# Patient Record
Sex: Male | Born: 1960 | Race: Black or African American | Hispanic: No | Smoking: Never smoker
Health system: Southern US, Community
[De-identification: ages and names within clinical notes are randomized; demographics above are authoritative.]

## PROBLEM LIST (undated history)

## (undated) DIAGNOSIS — C61 Malignant neoplasm of prostate: Secondary | ICD-10-CM

---

## 2006-08-09 ENCOUNTER — Inpatient Hospital Stay: Payer: Self-pay | Admitting: Surgery

## 2007-12-04 IMAGING — CT CT ABD-PELV W/O CM
1 of 2 series · 15 of 32 positions shown, 19 images · non-contrast
Comparison: none

REASON FOR EXAM: (1) rm 9   abd pain; (2) rm 9  abd pain
COMMENTS:

[Series 2: abd/pelvis · axial · 0.77mm/px · z∈[-448,-40]mm · 15 of 153 slices shown, 19 images]
[im 11/153  soft-tissue]
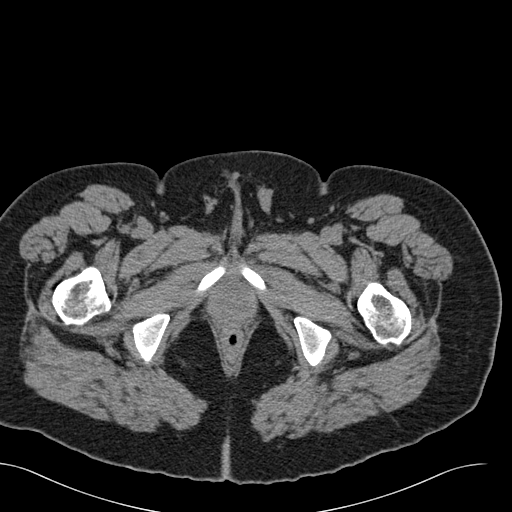
[im 11/153  bone]
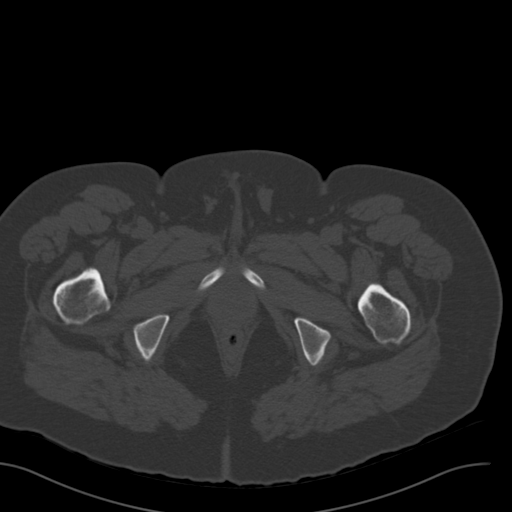
[im 22/153  soft-tissue]
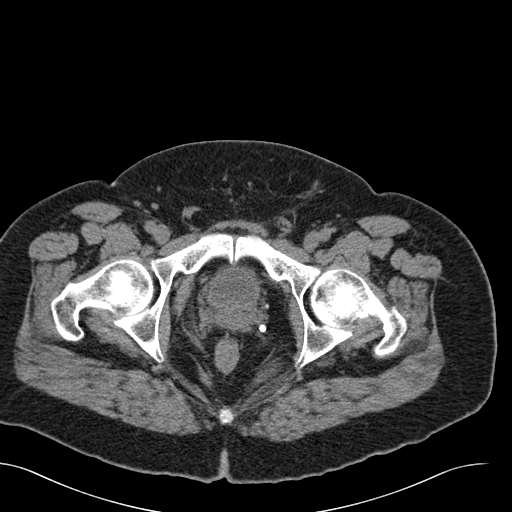
[im 33/153  soft-tissue]
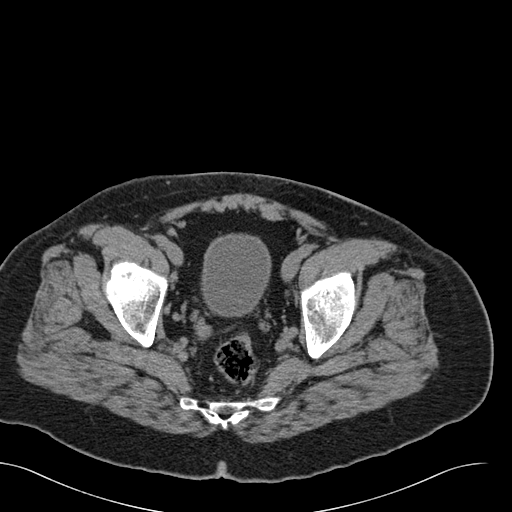
[im 44/153  soft-tissue]
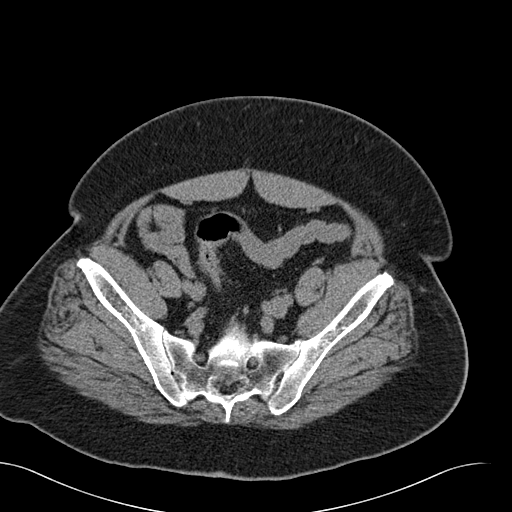
[im 55/153  soft-tissue]
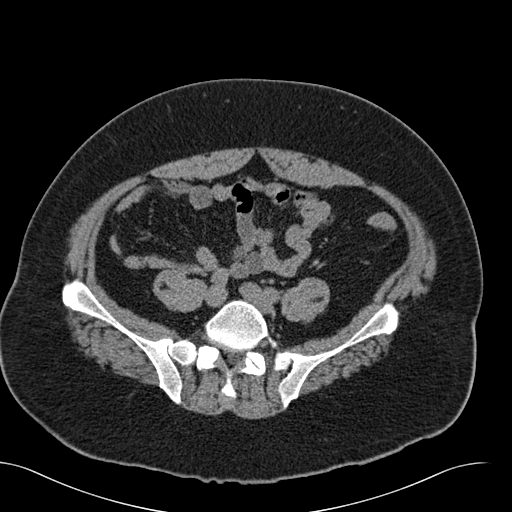
[im 66/153  soft-tissue]
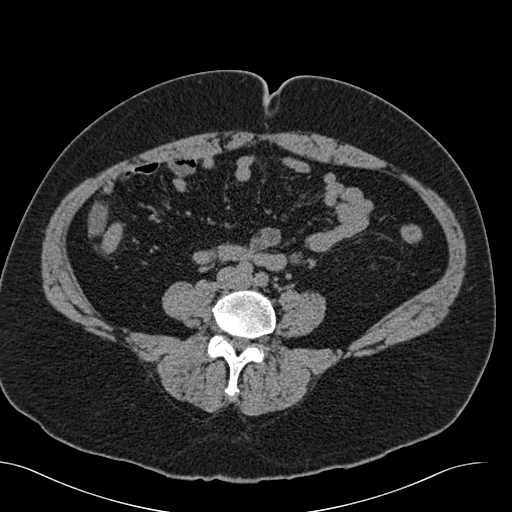
[im 77/153  soft-tissue]
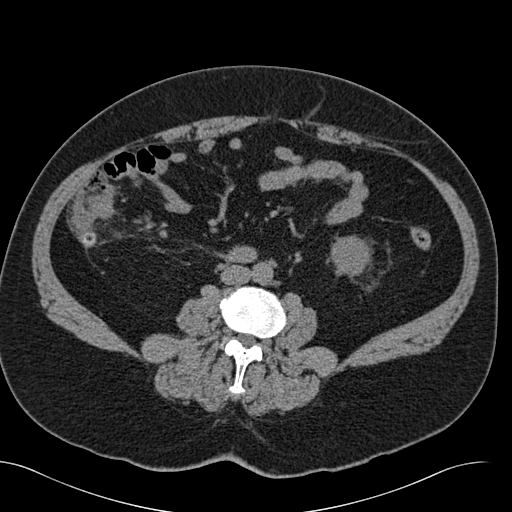
[im 87/153  soft-tissue]
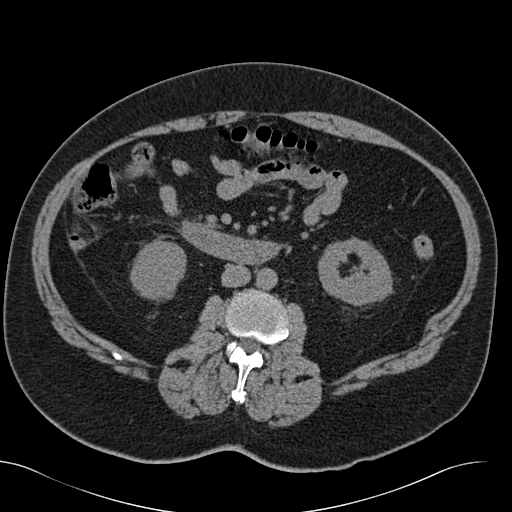
[im 98/153  soft-tissue]
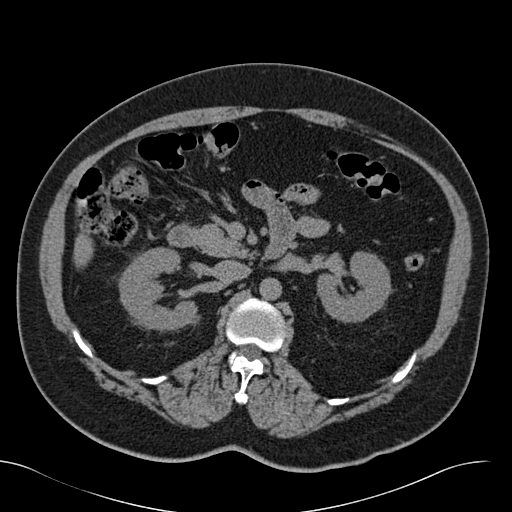
[im 98/153  bone]
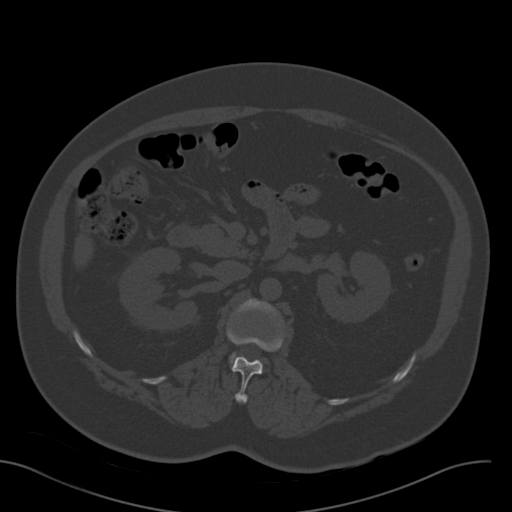
[im 109/153  soft-tissue]
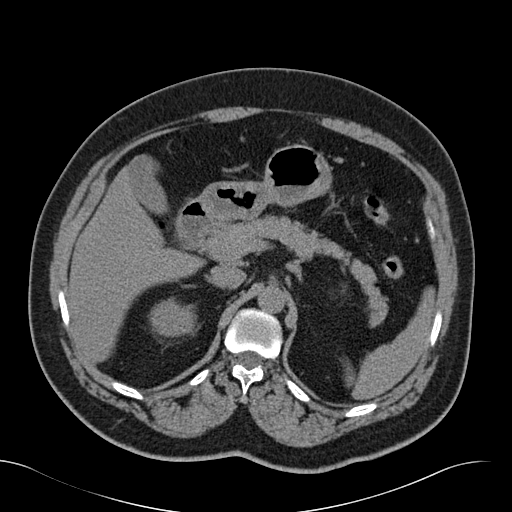
[im 120/153  soft-tissue]
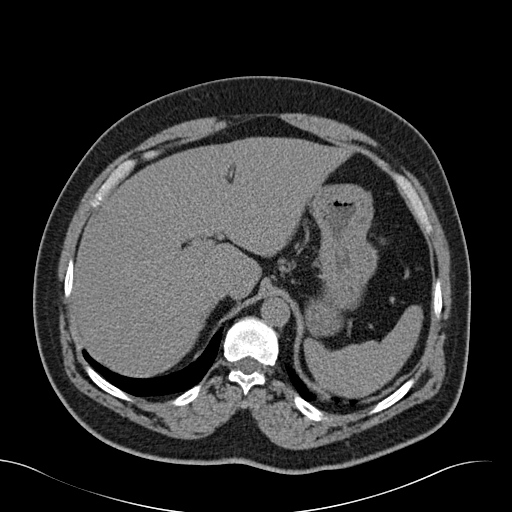
[im 131/153  soft-tissue]
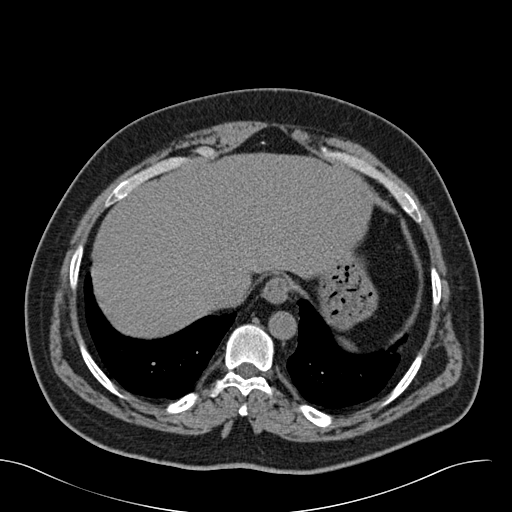
[im 131/153  lung]
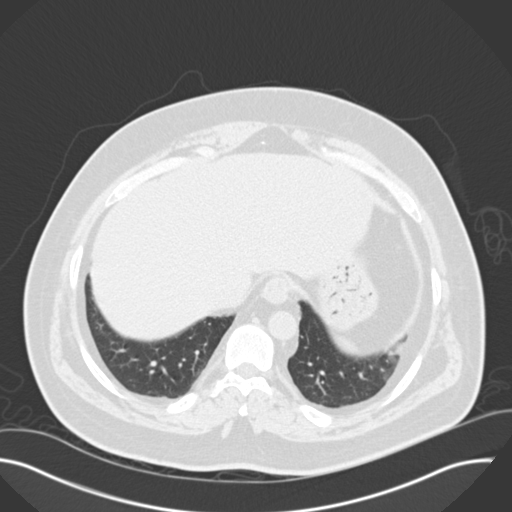
[im 136/153  lung]
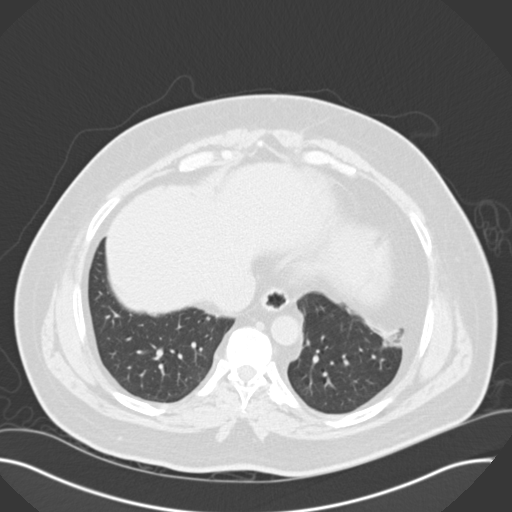
[im 142/153  soft-tissue]
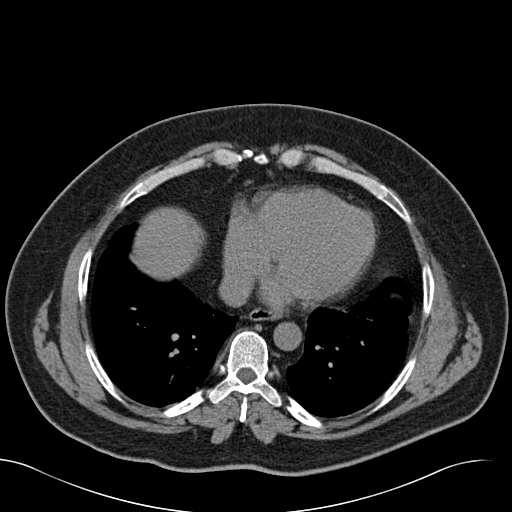
[im 142/153  lung]
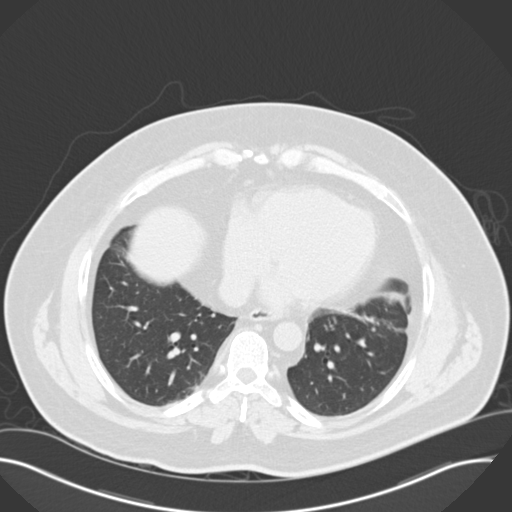
[im 147/153  lung]
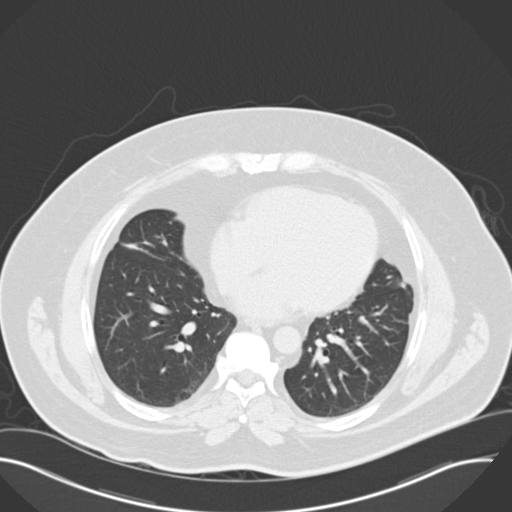

[15 of 32 positions shown; findings below may reference images not displayed]

PROCEDURE:     CT  - CT ABDOMEN AND PELVIS W[DATE]  [DATE]

RESULT:     The patient is complaining of abdominal discomfort.  The study
was performed with IV contrast or with oral contrast material.

There is an abnormal appearance to the RIGHT lower quadrant of the abdomen.
The appendix is dilated and edematous. There is increased density in the
periappendiceal fat. The abnormal findings are appreciated best on images 60
through 80. No extraluminal fluid or gas is identified.

There are punctate calcifications within the LEFT kidney but no evidence of
obstruction. The liver, gallbladder, pancreas, stomach, spleen, adrenal
glands, and RIGHT kidney are normal in appearance. The periaortic and
pericaval regions are normal in appearance. The unopacified loops of small
and large bowel appear normal outside of the RIGHT lower quadrant of the
abdomen. There is no evidence of ascites. The urinary bladder and prostate
gland are normal in appearance. The lung bases exhibit some atelectasis
versus scarring on the LEFT laterally. Trace pleural thickening versus
pleural fluid in the costophrenic gutter on the LEFT is noted.
IMPRESSION: 1. There are findings consistent with acute appendicitis.
2. I see no acute abnormality elsewhere within the abdomen or pelvis.
3. Minimal atelectasis and a trace of pleural fluid or pleural thickening
are noted in the posterior costophrenic gutter on the LEFT.

The findings were called to the [HOSPITAL] the conclusion of
the study.

## 2013-07-07 ENCOUNTER — Observation Stay: Payer: Self-pay | Admitting: Family Medicine

## 2013-07-07 ENCOUNTER — Emergency Department: Payer: Self-pay | Admitting: Emergency Medicine

## 2013-07-07 LAB — CBC
HCT: 48.9 % (ref 40.0–52.0)
HGB: 16.4 g/dL (ref 13.0–18.0)
MCH: 31.7 pg (ref 26.0–34.0)
MCHC: 33.5 g/dL (ref 32.0–36.0)
MCV: 95 fL (ref 80–100)
PLATELETS: 209 10*3/uL (ref 150–440)
RBC: 5.16 10*6/uL (ref 4.40–5.90)
RDW: 14.9 % — ABNORMAL HIGH (ref 11.5–14.5)
WBC: 6.1 10*3/uL (ref 3.8–10.6)

## 2013-07-07 LAB — COMPREHENSIVE METABOLIC PANEL
ALBUMIN: 3.6 g/dL (ref 3.4–5.0)
ALT: 71 U/L (ref 12–78)
AST: 69 U/L — AB (ref 15–37)
Alkaline Phosphatase: 127 U/L — ABNORMAL HIGH
Anion Gap: 8 (ref 7–16)
BUN: 19 mg/dL — ABNORMAL HIGH (ref 7–18)
Bilirubin,Total: 0.5 mg/dL (ref 0.2–1.0)
Calcium, Total: 9.4 mg/dL (ref 8.5–10.1)
Chloride: 99 mmol/L (ref 98–107)
Co2: 29 mmol/L (ref 21–32)
Creatinine: 1.49 mg/dL — ABNORMAL HIGH (ref 0.60–1.30)
EGFR (Non-African Amer.): 52 — ABNORMAL LOW
Glucose: 123 mg/dL — ABNORMAL HIGH (ref 65–99)
OSMOLALITY: 276 (ref 275–301)
Potassium: 3.8 mmol/L (ref 3.5–5.1)
Sodium: 136 mmol/L (ref 136–145)
Total Protein: 8.1 g/dL (ref 6.4–8.2)

## 2013-07-07 LAB — TROPONIN I
Troponin-I: <0.02 ng/mL
Troponin-I: <0.02 ng/mL

## 2013-07-07 LAB — PRO B NATRIURETIC PEPTIDE: B-Type Natriuretic Peptide: 14 pg/mL (ref 0–125)

## 2013-07-08 LAB — BASIC METABOLIC PANEL
ANION GAP: 8 (ref 7–16)
BUN: 21 mg/dL — AB (ref 7–18)
CO2: 28 mmol/L (ref 21–32)
Calcium, Total: 9.2 mg/dL (ref 8.5–10.1)
Chloride: 101 mmol/L (ref 98–107)
Creatinine: 1.36 mg/dL — ABNORMAL HIGH (ref 0.60–1.30)
EGFR (African American): >60
EGFR (Non-African Amer.): 59 — ABNORMAL LOW
GLUCOSE: 160 mg/dL — AB (ref 65–99)
OSMOLALITY: 280 (ref 275–301)
Potassium: 3.9 mmol/L (ref 3.5–5.1)
SODIUM: 137 mmol/L (ref 136–145)

## 2013-10-02 ENCOUNTER — Observation Stay: Payer: Self-pay | Admitting: Internal Medicine

## 2013-10-02 LAB — CBC
HCT: 56.5 % — AB (ref 40.0–52.0)
HGB: 18.7 g/dL — AB (ref 13.0–18.0)
MCH: 30.9 pg (ref 26.0–34.0)
MCHC: 33.1 g/dL (ref 32.0–36.0)
MCV: 94 fL (ref 80–100)
Platelet: 243 10*3/uL (ref 150–440)
RBC: 6.05 10*6/uL — ABNORMAL HIGH (ref 4.40–5.90)
RDW: 13.5 % (ref 11.5–14.5)
WBC: 8.2 10*3/uL (ref 3.8–10.6)

## 2013-10-02 LAB — PRO B NATRIURETIC PEPTIDE: B-Type Natriuretic Peptide: 11 pg/mL (ref 0–125)

## 2013-10-02 LAB — BASIC METABOLIC PANEL
Anion Gap: 6 — ABNORMAL LOW (ref 7–16)
BUN: 15 mg/dL (ref 7–18)
CALCIUM: 9.7 mg/dL (ref 8.5–10.1)
CHLORIDE: 97 mmol/L — AB (ref 98–107)
CREATININE: 1.45 mg/dL — AB (ref 0.60–1.30)
Co2: 32 mmol/L (ref 21–32)
EGFR (African American): >60
EGFR (Non-African Amer.): 54 — ABNORMAL LOW
Glucose: 125 mg/dL — ABNORMAL HIGH (ref 65–99)
OSMOLALITY: 272 (ref 275–301)
POTASSIUM: 3 mmol/L — AB (ref 3.5–5.1)
SODIUM: 135 mmol/L — AB (ref 136–145)

## 2013-10-02 LAB — TROPONIN I: Troponin-I: <0.02 ng/mL

## 2013-10-02 LAB — TSH: Thyroid Stimulating Horm: 1.82 u[IU]/mL

## 2013-10-02 LAB — MAGNESIUM: Magnesium: 1.5 mg/dL — ABNORMAL LOW

## 2013-10-02 LAB — PROTIME-INR
INR: 1
Prothrombin Time: 12.6 secs (ref 11.5–14.7)

## 2013-10-03 LAB — BASIC METABOLIC PANEL
ANION GAP: 8 (ref 7–16)
BUN: 12 mg/dL (ref 7–18)
CHLORIDE: 99 mmol/L (ref 98–107)
CO2: 31 mmol/L (ref 21–32)
Calcium, Total: 9.3 mg/dL (ref 8.5–10.1)
Creatinine: 1.19 mg/dL (ref 0.60–1.30)
EGFR (African American): >60
EGFR (Non-African Amer.): >60
GLUCOSE: 119 mg/dL — AB (ref 65–99)
Osmolality: 277 (ref 275–301)
Potassium: 3.2 mmol/L — ABNORMAL LOW (ref 3.5–5.1)
Sodium: 138 mmol/L (ref 136–145)

## 2013-10-03 LAB — CBC WITH DIFFERENTIAL/PLATELET
BASOS ABS: 0 10*3/uL (ref 0.0–0.1)
Basophil %: 0.5 %
Eosinophil #: 0.1 10*3/uL (ref 0.0–0.7)
Eosinophil %: 1.9 %
HCT: 52.9 % — ABNORMAL HIGH (ref 40.0–52.0)
HGB: 17.1 g/dL (ref 13.0–18.0)
Lymphocyte #: 1.9 10*3/uL (ref 1.0–3.6)
Lymphocyte %: 27.5 %
MCH: 30.2 pg (ref 26.0–34.0)
MCHC: 32.4 g/dL (ref 32.0–36.0)
MCV: 93 fL (ref 80–100)
MONO ABS: 0.8 x10 3/mm (ref 0.2–1.0)
Monocyte %: 12.3 %
NEUTROS PCT: 57.8 %
Neutrophil #: 4 10*3/uL (ref 1.4–6.5)
Platelet: 233 10*3/uL (ref 150–440)
RBC: 5.67 10*6/uL (ref 4.40–5.90)
RDW: 13.9 % (ref 11.5–14.5)
WBC: 6.9 10*3/uL (ref 3.8–10.6)

## 2013-10-03 LAB — TSH: THYROID STIMULATING HORM: 1.98 u[IU]/mL

## 2013-10-03 LAB — LIPID PANEL
CHOLESTEROL: 170 mg/dL (ref 0–200)
HDL: 40 mg/dL (ref 40–60)
LDL CHOLESTEROL, CALC: 75 mg/dL (ref 0–100)
Triglycerides: 276 mg/dL — ABNORMAL HIGH (ref 0–200)
VLDL CHOLESTEROL, CALC: 55 mg/dL — AB (ref 5–40)

## 2013-10-03 LAB — TROPONIN I: Troponin-I: <0.02 ng/mL

## 2013-10-03 LAB — HEMOGLOBIN A1C: Hemoglobin A1C: 6.6 % — ABNORMAL HIGH (ref 4.2–6.3)

## 2013-10-03 LAB — MAGNESIUM: MAGNESIUM: 2.2 mg/dL

## 2014-04-01 IMAGING — CR DG CHEST 2V
1 series · 2 of 2 positions shown · non-contrast
Comparison: CT abdomen/pelvis [DATE]

CLINICAL DATA: Cough and congestion 2 weeks.

EXAM:
CHEST  2 VIEW

[Series 1: w chest pa · 0.14mm/px · 2 of 2 slices shown]
[im 1/2]
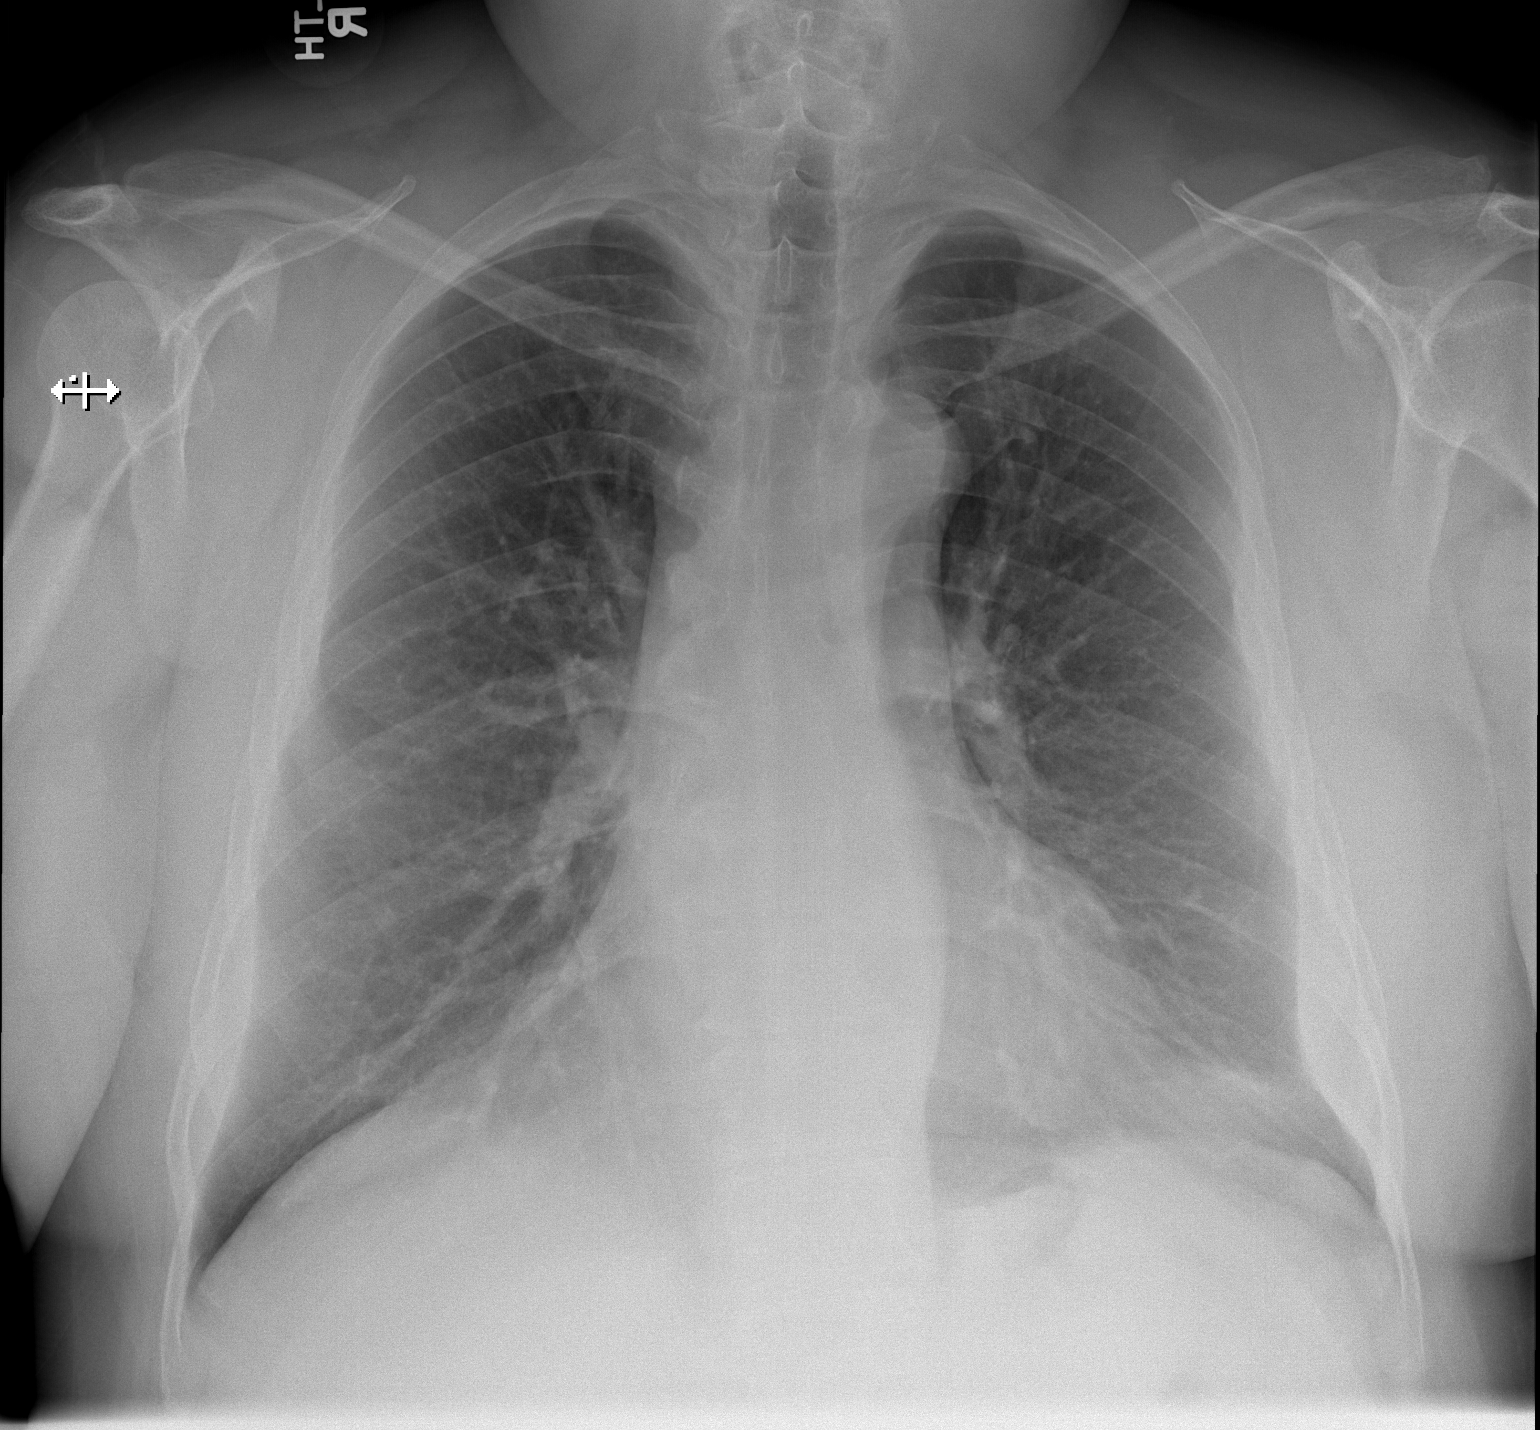
[im 2/2]
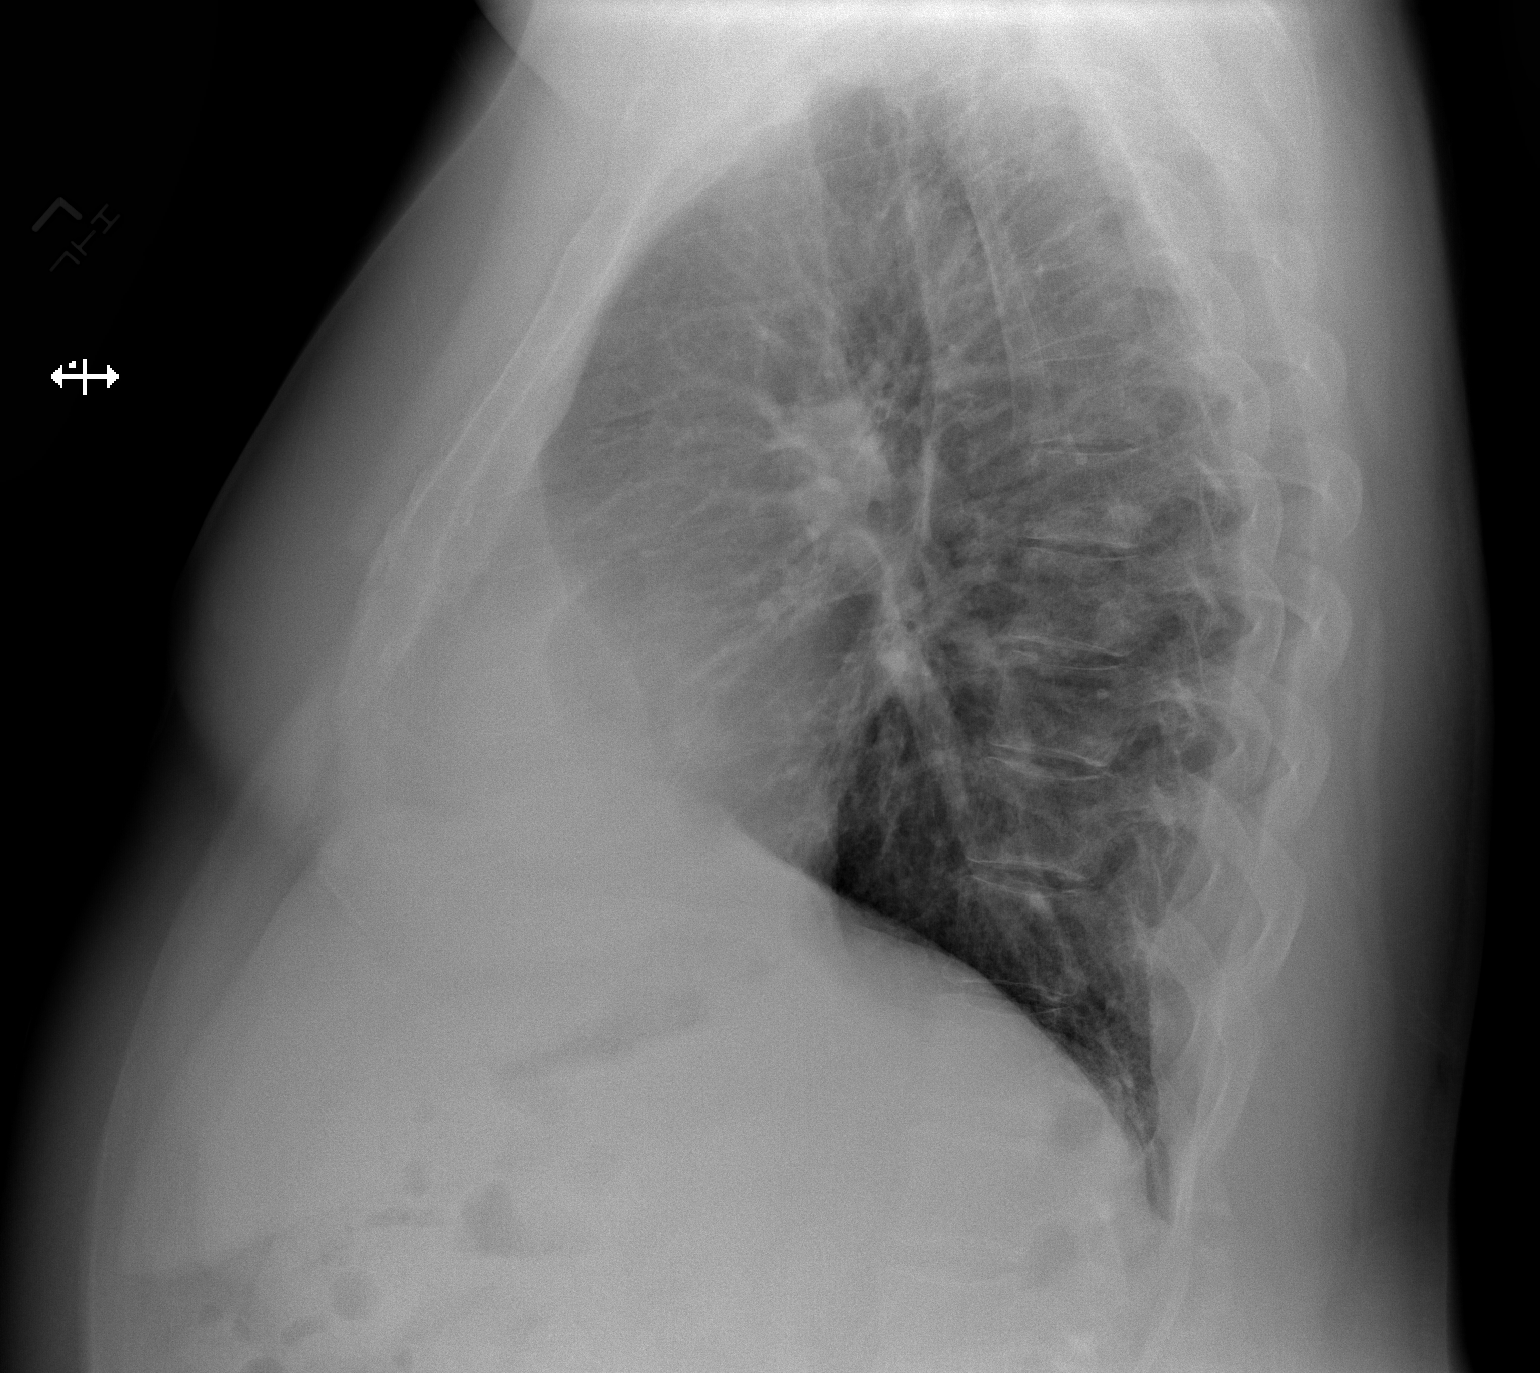

[2 of 2 positions shown; findings below may reference images not displayed]

FINDINGS: Lungs are adequately inflated with subtle opacification in the left
base just above the hemidiaphragm not well seen on the lateral film.
There is mild smooth pleural thickening of the lateral lower thorax
bilaterally left worse than right with similar changes seen on
patient's prior CT scan. Cardiomediastinal silhouette is within
normal. There is minimal spondylosis of the spine.
IMPRESSION: Subtle hazy opacification just above the left hemidiaphragm along
the left heart border likely prominent pericardial fat, although
cannot exclude a pulmonary parenchymal process.

## 2014-09-13 NOTE — Discharge Summary (Signed)
PATIENT NAME:  Paul Hickman, [name] MR#:  [id number] DATE OF BIRTH:  1960-10-10  DATE OF ADMISSION:  10/02/2013 DATE OF DISCHARGE:  10/03/2013   PRIMARY CARE PHYSICIAN:  Dr. Riley KillSwartz.    DISCHARGE DIAGNOSES:  Palpitations, hypertension, diabetes, hyperlipidemia.   CONDITION:  Stable.   CODE STATUS:  FULL CODE.   HOME MEDICATIONS:  Please refer to the medication reconciliation list.   DIET:  Low sodium, low fat.   ACTIVITY:  As tolerated.   FOLLOWUP CARE:  Follow up with PCP within 1 to 2 weeks.   REASON FOR ADMISSION:  Palpitations, shortness of breath.   HOSPITAL COURSE:  The patient is a 54 year old African American male with a history of hypertension, gastroesophageal reflux disease, hyperlipidemia, came to the ED due to palpitations and associated shortness of breath. The patient was TAKEN OFF LISINOPRIL DUE TO ALLERGY in February of this year and was started on Norvasc 2 days previous to this admission. For detailed history and physical examination, please refer to the admission note dictated by Dr. Elisabeth PigeonVachhani.  Laboratory data on admission date showed glucose 125, BUN 15, creatinine 1.45. Electrolytes were normal. His troponin was less than 0.02.  1.  Palpitations and uncontrolled hypertension.  After admission, the patient's troponin level has been negative. The patient got echocardiogram, for which report is pending. The patient's palpitations are better, but still has a little bit of palpitations without any chest pain or nausea, vomiting. The patient's hypertension has been controlled with atenolol, Norvasc.  In addition, the patient's hyperlipidemia has been treated with statin. Since the patient is getting Norvasc, I discontinued Zocor and start Lipitor. The patient's hypokalemia and hypomagnesemia has been treated with supplements. The patient's vital signs are stable.  Telemonitor shows normal sinus rhythm. Physical examination is unremarkable. He is clinically stable and will be discharged  to home today. The patient needs to follow up with PCP for echocardiogram report.    I discussed the patient's discharge plan with the patient, nurse, case manager.   TIME SPENT: About 37 minutes.   ____________________________ Shaune PollackQing Yocheved Depner, MD qc:dmm D: 10/03/2013 17:14:50 ET T: 10/03/2013 18:57:52 ET JOB#: [id number]  cc: Shaune PollackQing Kyana Aicher, MD, <Dictator> Shaune PollackQING Allexis Bordenave MD ELECTRONICALLY SIGNED 10/04/2013 14:51

## 2014-09-13 NOTE — H&P (Signed)
PATIENT NAME:  Paul Hickman, Paul Hickman MR#:  [id number] DATE OF BIRTH:  [birth year]  DATE OF ADMISSION:  07/07/2013  PRIMARY CARE PHYSICIAN: At [facility].   CHIEF COMPLAINT: Lower lip swelling and shortness of breath.   HISTORY OF PRESENT ILLNESS: This is a 54 year old male who presents to the hospital due to significant swelling of his lower lip that began earlier today and also associated with some wheezing and shortness of breath. The patient says that he has been fighting an upper respiratory infection now for the past 2 to 3 weeks. He thought it had improved about a week or so ago, but his no postnasal drip and cough and congestion returned. He, therefore, came to the ER for further evaluation earlier today. He was diagnosed with possibly having acute bronchitis and was discharged on an oral prednisone taper and Levaquin. He was given 1 dose of IV Levaquin here in the ER. The patient went home. Had not had his prescription even filled yet when he noticed that his lower lip started to significantly swell up. He said that this has happened previously in the past, but his lower lip has not been this significantly swollen before. Previously, he used to take a little bit of Zyrtec, and his symptom would resolve by itself. Since his symptoms were worse this time, he came to the ER for further evaluation. The patient has been on an ACE inhibitor but has been on it for now 10 to 14 years. He has never really truly sought the help of an allergist. The patient here likely has underlying angioedema, and hospitalist services were contacted for further treatment and evaluation.   REVIEW OF SYSTEMS:  CONSTITUTIONAL: No documented fever. No weight gain. No weight loss.  EYES: No blurry or double vision.  ENT: No tinnitus. Positive postnasal drip. No redness of the oropharynx.  RESPIRATORY: Positive cough. Positive wheeze. No hemoptysis. Positive dyspnea on exertion.  CARDIOVASCULAR: Positive chest tightness on  inspiration. Positive palpitations. No syncope.  GASTROINTESTINAL: No nausea. No vomiting. No diarrhea. No abdominal pain. No melena or hematochezia.  GENITOURINARY: No dysuria or hematuria.  ENDOCRINE: No polyuria or nocturia. No heat or cold intolerance.  HEMATOLOGIC: No anemia. No bruising or bleeding.  INTEGUMENTARY: No rashes or lesions.  MUSCULOSKELETAL: No arthritis. No swelling. No Gout .  NEUROLOGIC: No numbness. No tingling. No ataxia. No seizure-type activity.  PSYCHIATRIC: No anxiety. No insomnia. No ADD.   PAST MEDICAL HISTORY: Consistent with hypertension, gout, GERD, hyperlipidemia.   ALLERGIES: No known drug allergies.   SOCIAL HISTORY: No smoking. Occasional alcohol use. No illicit drug abuse. Lives at home by himself.   FAMILY HISTORY: The patient's mother is alive. She is also diabetic. Father died from complications of diabetes.   CURRENT MEDICATIONS: As follows: Allopurinol 300 mg daily, aspirin 81 mg daily, HCTZ/triamterene 50/75 mg tablet 1 tab daily, Levaquin 500 mg daily for 10 days, lisinopril 40 mg daily, prednisone taper starting at 60 mg down to 10 mg over the next 6 days, Prilosec over the counter 20 mg daily, albuterol inhaler 2 puffs q.4 hours as needed, Tenormin 100 mg daily, Zocor 20 mg daily.   PHYSICAL EXAMINATION: Presently is as follows:  VITAL SIGNS: Are noted to be temperature 97, pulse 115, respirations 18, blood pressure 172/101, sats 96% on room air.  GENERAL: He is a pleasant-appearing male in mild respiratory distress.  HEAD, EYES, EARS, NOSE, THROAT: The patient is atraumatic, normocephalic. Extraocular muscles are intact. Pupils are equal and  reactive to light. Sclerae anicteric. No conjunctival injection. No oropharyngeal erythema. His lower lip is significantly swollen.  NECK: Supple. There is no jugular distention. No bruits. No lymphadenopathy or thyromegaly.  HEART: Regular rate, and rhythm, tachycardic. No murmurs. No rubs. No clicks.   LUNGS: He has coarse wheezing and rhonchi diffusely. Negative use of accessory muscles. No dullness to percussion.  ABDOMEN: Soft, flat, nontender, nondistended. Has good bowel sounds. No hepatosplenomegaly appreciated.  EXTREMITIES: No evidence of any cyanosis, clubbing or peripheral edema. Has +2 pedal and radial pulses bilaterally.  NEUROLOGIC: The patient is alert, awake and oriented x 3 with no focal motor or sensory deficits appreciated bilaterally.  SKIN: Moist and warm with no rashes appreciated.  LYMPHATIC: There is no cervical or axillary lymphadenopathy.   LABORATORY EXAM: Showed a serum glucose of 123, BUN 19, creatinine 1.4, sodium 136, potassium 3.8, chloride 99, bicarb 29. LFTs are within normal limits. Troponin less than 0.02. White cell count 6.1, hemoglobin 16.4, hematocrit 48.9, platelet count of 209. The patient did have a chest x-ray done which showed no evidence of any acute cardiopulmonary disease.   ASSESSMENT AND PLAN: This is a 54 year old male with a history of hypertension, hyperlipidemia, gout, gastroesophageal reflux disease. Presented to the hospital earlier today due to upper respiratory infection not improving in 2 to 3 weeks. Was discharged from the Emergency Room on oral prednisone and Levaquin. Now returns with significant lower lip swelling and likely angioedema.  1. Angioedema: The exact etiology of this is unclear but suspected to be secondary to the ACE inhibitor, although he has been on it now 10 plus years. The patient says that he has had this lower lip swelling in the past through time since 2002, but it always resolves by itself within 24 hours. It has never been this bad in the previous episodes. For now, will discontinue his ACE inhibitor. I will go ahead and check C4 levels to possibly rule out C1 esterase inhibitor deficiency, although that is unlikely. Continue supportive care with intravenous steroids, as needed Benadryl and start him on some ranitidine.  The patient likely needs to be officially evaluated by an allergist as an outpatient.  2. Hypertension: I will continue triamterene/HCTZ and his Tenormin. Hold his ACE inhibitor indefinitely. Use p.r.n. intravenous hydralazine if needed.  3. Gastroesophageal reflux disease: Continue with his Protonix.  4. History of gout: There is no acute gout attack. Continue allopurinol.  5. Hyperlipidemia: Continue simvastatin.   The patient is a FULL CODE.   TIME SPENT ON ADMISSION: 45 minutes.    ____________________________ Rolly PancakeVivek J. Cherlynn KaiserSainani, MD vjs:gb D: 07/07/2013 20:50:23 ET T: 07/08/2013 00:54:19 ET JOB#: [id number]  cc: Rolly PancakeVivek J. Cherlynn KaiserSainani, MD, <Dictator> Houston SirenVIVEK J Jamy Cleckler MD ELECTRONICALLY SIGNED 07/08/2013 19:08

## 2014-09-13 NOTE — H&P (Signed)
PATIENT NAME:  Paul Hickman, Paul Hickman MR#:  [id number] DATE OF BIRTH:  [birth year]  DATE OF ADMISSION:  10/02/2013  PRIMARY CARE PHYSICIAN: At [facility].   CHIEF COMPLAINT: Palpitations and shortness of breath.   REFERRING EMERGENCY ROOM PHYSICIAN: Rockne MenghiniAnne-Caroline Norman, MD  HISTORY OF PRESENT ILLNESS:  A 54 year old male with past medical history of hypertension, gout, gastroesophageal reflux disease, hyperlipidemia, who was recently diagnosed with ALLERGY TO LISINOPRIL in February 2015 and taken off from that. Since then, he said that his blood pressure is running high, before that pressure was remaining 130/80 but since then, after taking off from lisinopril, pressure went up to 160/100 to 110. He had a visit to his [facility]'s [facility] and they started on amlodipine which he started taking 2 days ago but for the last 3 days the patient is also feeling palpitations which is intermittent, comes for a few seconds and then gone and every few minutes after again he feels that palpitation again. He denies any chest pain but complains that he feels some shortness of breath with this palpitation. There is no aggravating or relieving factor with that and, worrying about this, he decided to come to the Emergency Room after he spoke to his primary care physician and he suggested the same. In the ER, his blood pressure was high, diastolic was much higher so given injection hydralazine and given as admission to hospitalist team for further management of his issue. Was also found to be hypokalemic, potassium level 3, and hemoglobin of 18.7.   REVIEW OF SYSTEMS:    CONSTITUTIONAL: Negative for fever, fatigue, weakness, pain or weight loss.  EYES: No blurring, double vision, discharge or redness.  EARS, NOSE AND THROAT:  No tinnitus, ear pain or hearing loss. RESPIRATORY:  Has been feeling some shortness of breath but no wheezing, cough or sputum.  CARDIOVASCULAR: No chest pain, edema, arrhythmia but has  been feeling palpitations on and off.  GASTROINTESTINAL: No nausea, vomiting, diarrhea, abdominal pain.  GENITOURINARY: No dysuria, hematuria or increased frequency.  ENDOCRINE: No heat or cold intolerance, no excessive sweating.  SKIN: No acne, rashes or lesions on the skin.  MUSCULOSKELETAL: No pain or swelling in the joints.  NEUROLOGICAL: No numbness, weakness, tremor or vertigo.  PSYCHIATRIC: No anxiety, insomnia, bipolar disorder.   PAST MEDICAL HISTORY:  Hypertension, hyperlipidemia, gout and gastroesophageal reflux disease.   SOCIAL HISTORY: No smoking, occasional alcohol use. No illicit drug use. Lives at home by himself.   FAMILY HISTORY: Strong diabetes history in sister, brother and mother. Father died from complications of diabetes. His brother had a stroke twice, 1 at 7746 and the second one at 1856, when he died.   HOME MEDICATIONS: 1.  ProAir HFA 2 puffs inhalation every 4 hours.  2.  Prilosec 20 mg oral tablet once a day.  3.  Hydrochlorothiazide and triamterene 50 plus 75 mg oral tablet once a day.  4.  Cholecalciferol 1000 International Units oral tablet once a day.  5.  Atenolol 50 mg oral tablet 2 tablets once a day.  6.  Aspirin 81 mg oral once a day.  7.  Amlodipine 10 mg oral take 1/2 tablet once a day.  8.  Allopurinol 300 oral once a day.   PHYSICAL EXAMINATION: VITAL SIGNS:  In ER, temperature 97.7, pulse 99, respirations 20, blood pressure 168/112 which came down to 156/99 after injection hydralazine and pulse oximetry is 98% on room air.  GENERAL: The patient is fully alert  and oriented to time, place and person. Does not appear in any acute distress.  HEENT: Head and neck atraumatic. Conjunctiva pink. Oral mucosa moist.  NECK: Supple. No JVD.  RESPIRATORY: Bilateral equal and clear air entry.  CARDIOVASCULAR: S1, S2 present, regular. No murmur.  ABDOMEN: Soft, nontender. Bowel sounds present. No organomegaly.  SKIN: No rashes.  LEGS: No edema.  JOINTS: No  swelling or tenderness.  NEUROLOGICAL: Power 5 out of 5. Moves all 4 limbs. No sensory loss. No tremor or rigidity.  PSYCHIATRIC: Does not appear in any acute psychiatric illness.   LABORATORY, DIAGNOSTIC AND RADIOLOGICAL DATA:  Glucose 125. BNP 11. BUN is 15, creatinine 1.45, sodium 135, potassium is 3, chloride is 97, CO2 is 32, calcium is 9.7, magnesium is 1.5. Troponin less than 0.02. TSH is 1.82. WBC 8.2, hemoglobin 18.7, platelet count is 243 and MCV is 94. INR is 1.   ASSESSMENT AND PLAN: A 54 year old male with a past medical history of hypertension, hyperlipidemia and gout, presented to Emergency Room with high blood pressure and intermittent palpitations.  1.  Palpitations and uncontrolled hypertension. We will monitor him on telemetry with serial troponins and will get echocardiogram done.  2.  Hypertension. We will continue his home medication, triamterene and hydrochlorothiazide. Will also continue his atenolol 100 mg as he was taking at home and amlodipine which he only took 2 days now so we will continue that and once it has more stable blood level after 4 to 5 days might have better effect on maintaining the blood pressure.  3.  Hyperlipidemia. Continue statin.  4.  Gout. Continue allopurinol.  5.  Hypokalemia. Replace orally and check tomorrow.  6.  Hypomagnesemia. Replace IV and check tomorrow.  7.  Elevated hemoglobin, mostly polycythemia. Will have to get further workup as per the hematology plan so I ordered hematology consult for that. Maybe this is a reason for his palpitation. Will follow hematology.  8.  CODE STATUS: Full code.   TOTAL TIME SPENT ON THIS ADMISSION: 50 minutes.    ____________________________ Hope Pigeon Elisabeth Pigeon, MD vgv:cs D: 10/02/2013 20:05:15 ET T: 10/02/2013 20:26:47 ET JOB#: [id number]  cc: Hope Pigeon. Elisabeth Pigeon, MD, <Dictator> Altamese Dilling MD ELECTRONICALLY SIGNED 10/15/2013 16:30

## 2014-09-13 NOTE — Discharge Summary (Signed)
PATIENT NAME:  Paul Hickman, Paul Hickman MR#:  [id number] DATE OF BIRTH:  31-Mar-1961  DATE OF ADMISSION:  07/07/2013 DATE OF DISCHARGE:  07/08/2013  REASON FOR ADMISSION: Angioedema.   DISCHARGE DIAGNOSES: 1.  Angioedema.  2.  Acute bronchitis.  3.  Hypertension.  4.  Gastroesophageal reflux disease.   5.  Gout.  6.  Hyperlipidemia.   DISPOSITION: Home.   FOLLOWUP: [facility]   MEDICATIONS: Aspirin 81 mg daily, hydrochlorothiazide with triamterene 50/75 mg daily, allopurinol 300 mg daily, Prilosec 20 mg once a day, Tenormin 100 mg once a day, Zocor 20 mg once a day, ProAir HFA 90 mcg 2 puffs every 4 hours for wheezing, right now it is scheduled and changed to p.Hickman.n. when needed; prednisone 20 mg once a day for 2 days, azithromycin 250 mg once a day for 4 days.   HOSPITAL COURSE: A nice 54 year old gentleman admitted on 07/07/2013 with a history of shortness of breath and lip swelling. The patient had problems with lip swelling earlier during the day and then started associated wheezing and shortness of breath. The patient has been fighting an upper respiratory infection for 2 to 3 weeks without any improvement and then started having postnasal drip and congestion. He was seen before in the ER and was diagnosed with bronchitis and discharged on Levaquin and prednisone. He did not do take all his prednisone, actually he had some left.  He received 1 dose of antibiotics.  At this moment, he is going to be discharged on Zyrtec. The patient has been on his any an ACE inhibitor for 10 to 14 years and he states that the angioedema is something that happens at least once a year. At this moment, this time was associated with significant shortness of breath, (and edema of lip. we recommended to stop it.  The patient is going to go back to [place] primary care physician in [place].  The patient states that he has been taking lisinopril despite the angioedema because he is healthy and he has never had any major complications  other than occasional lip swelling. At this moment, again, the patient has been counseled and he is not going to take that anymore.  As far as his other medical problems, they are stable.   TIME SPENT: I spent about 40 minutes with this discharge.   The patient is going to be discharged on azithromycin and 2 days' course of prednisone for treatment of acute bronchitis as well.   ____________________________ Felipa Furnaceoberto Sanchez Gutierrez, MD rsg:cs D: 07/08/2013 14:26:29 ET T: 07/08/2013 15:04:11 ET JOB#: [id number]  cc: Felipa Furnaceoberto Sanchez Gutierrez, MD, <Dictator> [facility]  Milly Goggins Juanda ChanceSANCHEZ GUTIERRE MD ELECTRONICALLY SIGNED 07/13/2013 20:23

## 2022-12-23 ENCOUNTER — Emergency Department
Admission: EM | Admit: 2022-12-23 | Discharge: 2022-12-23 | Disposition: A | Discharge status: Home / Self Care | Payer: No Typology Code available for payment source | Attending: Emergency Medicine | Admitting: Emergency Medicine

## 2022-12-23 ENCOUNTER — Other Ambulatory Visit: Payer: Self-pay

## 2022-12-23 DIAGNOSIS — M549 Dorsalgia, unspecified: Secondary | ICD-10-CM | POA: Diagnosis present

## 2022-12-23 DIAGNOSIS — M4807 Spinal stenosis, lumbosacral region: Secondary | ICD-10-CM | POA: Diagnosis not present

## 2022-12-23 MED ORDER — HYDROCODONE-ACETAMINOPHEN 5-325 MG PO TABS: 1.0000 | ORAL_TABLET | Freq: Four times a day (QID) | ORAL | 0 refills | Status: DC | PRN

## 2022-12-23 MED ORDER — METHYLPREDNISOLONE 4 MG PO TBPK: ORAL_TABLET | ORAL | 0 refills | Status: DC

## 2022-12-23 MED ORDER — KETOROLAC TROMETHAMINE 60 MG/2ML IM SOLN
60.0000 mg | Freq: Once | INTRAMUSCULAR | Status: AC
  Administered 2022-12-23: 60 mg via INTRAMUSCULAR
  Filled 2022-12-23: qty 2

## 2022-12-23 MED ORDER — ONDANSETRON 4 MG PO TBDP
4.0000 mg | ORAL_TABLET | Freq: Once | ORAL | Status: AC
  Administered 2022-12-23: 4 mg via ORAL
  Filled 2022-12-23: qty 1

## 2022-12-23 MED ORDER — DEXAMETHASONE SODIUM PHOSPHATE 10 MG/ML IJ SOLN
10.0000 mg | Freq: Once | INTRAMUSCULAR | Status: AC
  Administered 2022-12-23: 10 mg via INTRAMUSCULAR
  Filled 2022-12-23: qty 1

## 2022-12-23 MED ORDER — HYDROCODONE-ACETAMINOPHEN 5-325 MG PO TABS
2.0000 | ORAL_TABLET | Freq: Once | ORAL | Status: AC
  Administered 2022-12-23: 2 via ORAL
  Filled 2022-12-23: qty 2

## 2022-12-23 NOTE — ED Provider Notes (Signed)
[facility] Provider Note    Event Date/Time   First MD Initiated Contact with Patient 12/23/22 [phone]     (approximate)   History   Back Pain   HPI  Paul Hickman is a 62 y.o. male  here with back pain. Pt reports 3-4 weeks of aching, throbbing back pain. It began as he caught himself getting out of his truck. He has been seen by the [place] and had MRI performed in last few weeks, which showed significant spinal stenosis. He has active prostate CA but he has no bone mets on MRI per his report. He states that he received "a shot" 3 weeks ago and felt "great," but pain has returned over the past week. It is mostly with movement, walking, weightbearing. No weakness or numbness in his legs. No loss of bowel or bladder continence.       Physical Exam   Triage Vital Signs: ED Triage Vitals  Encounter Vitals Group     BP 12/23/22 0430 (!) 158/98     Systolic BP Percentile --      Diastolic BP Percentile --      Pulse Rate 12/23/22 0430 83     Resp 12/23/22 0430 20     Temp 12/23/22 0430 98.3 F (36.8 C)     Temp Source 12/23/22 0430 Oral     SpO2 12/23/22 0430 100 %     Weight 12/23/22 0427 236 lb (107 kg)     Height 12/23/22 0427 5\' 7"  (1.702 m)     Head Circumference --      Peak Flow --      Pain Score 12/23/22 0427 10     Pain Loc --      Pain Education --      Exclude from Growth Chart --     Most recent vital signs: Vitals:   12/23/22 0500 12/23/22 0629  BP: (!) 143/80 (!) 149/95  Pulse: 80 98  Resp: 13 18  Temp: 98.3 F (36.8 C) 98 F (36.7 C)  SpO2: 100% 100%     General: Awake, no distress.  CV:  Good peripheral perfusion.  Resp:  Normal work of breathing.  Abd:  No distention.  Other:  Significant midline and paraspinal TTP lower lumbosacral spine.    ED Results / Procedures / Treatments   Labs (all labs ordered are listed, but only abnormal results are displayed) Labs Reviewed - No data to  display   EKG    RADIOLOGY    I also independently reviewed and agree with radiologist interpretations.   PROCEDURES:  Critical Care performed: No   MEDICATIONS ORDERED IN ED: Medications  HYDROcodone-acetaminophen (NORCO/VICODIN) 5-325 MG per tablet 2 tablet (2 tablets Oral Given 12/23/22 0511)  ondansetron (ZOFRAN-ODT) disintegrating tablet 4 mg (4 mg Oral Given 12/23/22 0511)  dexamethasone (DECADRON) injection 10 mg (10 mg Intramuscular Given 12/23/22 0511)  ketorolac (TORADOL) injection 60 mg (60 mg Intramuscular Given 12/23/22 0511)     IMPRESSION / MDM / ASSESSMENT AND PLAN / ED COURSE  I reviewed the triage vital signs and the nursing notes.                              Differential diagnosis includes, but is not limited to, lumbar stenosis, radiculopathy, arthritis, DDD, pain related to prostate CA  Patient's presentation is most consistent with acute presentation with potential threat to life or bodily function.  62 yo M here with back pain, worse w/ any movement or walking. Pt has had an extensive recent w/u including MRI within the last 2 weeks, which reportedly showed severe spinal stenosis but no fx or metastases (h/o active prostate CA). Suspect his steroids he received initially have worn off and he is now having recurrent pain. No signs of new cord compression or weakness. No fever, chills, or other red flag sx. He feels markedly improved with tx here. Given recent extensive w/u and imaging, do not feel repeat imaging is indicated. Will treat with steroid course, analgesia, and outpt follow-up.      FINAL CLINICAL IMPRESSION(S) / ED DIAGNOSES   Final diagnoses:  Spinal stenosis of lumbosacral region     Rx / DC Orders   ED Discharge Orders          Ordered    methylPREDNISolone (MEDROL DOSEPAK) 4 MG TBPK tablet  Status:  Discontinued        12/23/22 0609    HYDROcodone-acetaminophen (NORCO/VICODIN) 5-325 MG tablet  Every 6 hours PRN,   Status:   Discontinued        12/23/22 0609    HYDROcodone-acetaminophen (NORCO/VICODIN) 5-325 MG tablet  Every 6 hours PRN        12/23/22 0628    methylPREDNISolone (MEDROL DOSEPAK) 4 MG TBPK tablet        12/23/22 7253             Note:  This document was prepared using Dragon voice recognition software and may include unintentional dictation errors.   Shaune Pollack, MD 12/23/22 [phone]

## 2022-12-23 NOTE — ED Notes (Addendum)
Went over d/c paperwork at this time with patient. Pt had no questions, comments or concerns after review and verbally understood them. Pt was notified the medications was sent to [facility] off of [address].

## 2022-12-23 NOTE — ED Triage Notes (Addendum)
Pt to ED via EMS from home, pt had a fall from his truck x70month ago pt was seen and had PT since going home. Pt woke up this morning in more pain than normal in his back, pt denies any new injury to area. Pt denies any urinary symptoms. Pt has prostate cancer, currently taking PO medication for treatment.

## 2023-01-11 ENCOUNTER — Encounter
Admission: EM | Disposition: A | Discharge status: Home / Self Care | Payer: Self-pay | Source: Home / Self Care | Attending: Osteopathic Medicine

## 2023-01-11 ENCOUNTER — Inpatient Hospital Stay
Admit: 2023-01-11 | Discharge: 2023-01-11 | Disposition: A | Discharge status: Home / Self Care | Payer: No Typology Code available for payment source | Attending: Internal Medicine

## 2023-01-11 ENCOUNTER — Inpatient Hospital Stay
Admission: EM | Admit: 2023-01-11 | Discharge: 2023-01-13 | DRG: 378 | Disposition: A | Discharge status: Home / Self Care | Payer: No Typology Code available for payment source | Attending: Osteopathic Medicine | Admitting: Osteopathic Medicine

## 2023-01-11 ENCOUNTER — Other Ambulatory Visit: Payer: Self-pay

## 2023-01-11 ENCOUNTER — Inpatient Hospital Stay: Payer: No Typology Code available for payment source

## 2023-01-11 ENCOUNTER — Emergency Department: Payer: No Typology Code available for payment source

## 2023-01-11 ENCOUNTER — Inpatient Hospital Stay: Payer: No Typology Code available for payment source | Admitting: Certified Registered"

## 2023-01-11 DIAGNOSIS — C61 Malignant neoplasm of prostate: Secondary | ICD-10-CM | POA: Diagnosis present

## 2023-01-11 DIAGNOSIS — E876 Hypokalemia: Secondary | ICD-10-CM | POA: Diagnosis present

## 2023-01-11 DIAGNOSIS — I129 Hypertensive chronic kidney disease with stage 1 through stage 4 chronic kidney disease, or unspecified chronic kidney disease: Secondary | ICD-10-CM | POA: Diagnosis present

## 2023-01-11 DIAGNOSIS — K2100 Gastro-esophageal reflux disease with esophagitis, without bleeding: Secondary | ICD-10-CM | POA: Diagnosis present

## 2023-01-11 DIAGNOSIS — Z7984 Long term (current) use of oral hypoglycemic drugs: Secondary | ICD-10-CM

## 2023-01-11 DIAGNOSIS — N1831 Chronic kidney disease, stage 3a: Secondary | ICD-10-CM | POA: Diagnosis present

## 2023-01-11 DIAGNOSIS — R000 Tachycardia, unspecified: Secondary | ICD-10-CM | POA: Diagnosis present

## 2023-01-11 DIAGNOSIS — I2489 Other forms of acute ischemic heart disease: Secondary | ICD-10-CM | POA: Diagnosis present

## 2023-01-11 DIAGNOSIS — Z8546 Personal history of malignant neoplasm of prostate: Secondary | ICD-10-CM

## 2023-01-11 DIAGNOSIS — R079 Chest pain, unspecified: Secondary | ICD-10-CM | POA: Diagnosis not present

## 2023-01-11 DIAGNOSIS — K264 Chronic or unspecified duodenal ulcer with hemorrhage: Principal | ICD-10-CM | POA: Diagnosis present

## 2023-01-11 DIAGNOSIS — D649 Anemia, unspecified: Principal | ICD-10-CM

## 2023-01-11 DIAGNOSIS — K921 Melena: Secondary | ICD-10-CM | POA: Diagnosis present

## 2023-01-11 DIAGNOSIS — K2289 Other specified disease of esophagus: Secondary | ICD-10-CM | POA: Diagnosis present

## 2023-01-11 DIAGNOSIS — E785 Hyperlipidemia, unspecified: Secondary | ICD-10-CM | POA: Diagnosis present

## 2023-01-11 DIAGNOSIS — J449 Chronic obstructive pulmonary disease, unspecified: Secondary | ICD-10-CM | POA: Diagnosis present

## 2023-01-11 DIAGNOSIS — C7951 Secondary malignant neoplasm of bone: Secondary | ICD-10-CM | POA: Diagnosis present

## 2023-01-11 DIAGNOSIS — Z6836 Body mass index (BMI) 36.0-36.9, adult: Secondary | ICD-10-CM

## 2023-01-11 DIAGNOSIS — E1122 Type 2 diabetes mellitus with diabetic chronic kidney disease: Secondary | ICD-10-CM | POA: Diagnosis present

## 2023-01-11 DIAGNOSIS — R634 Abnormal weight loss: Secondary | ICD-10-CM | POA: Diagnosis present

## 2023-01-11 DIAGNOSIS — Z79899 Other long term (current) drug therapy: Secondary | ICD-10-CM

## 2023-01-11 DIAGNOSIS — K922 Gastrointestinal hemorrhage, unspecified: Secondary | ICD-10-CM | POA: Diagnosis present

## 2023-01-11 DIAGNOSIS — K5900 Constipation, unspecified: Secondary | ICD-10-CM | POA: Diagnosis present

## 2023-01-11 DIAGNOSIS — Z923 Personal history of irradiation: Secondary | ICD-10-CM

## 2023-01-11 DIAGNOSIS — D62 Acute posthemorrhagic anemia: Secondary | ICD-10-CM | POA: Diagnosis present

## 2023-01-11 DIAGNOSIS — Z888 Allergy status to other drugs, medicaments and biological substances status: Secondary | ICD-10-CM | POA: Diagnosis not present

## 2023-01-11 HISTORY — PX: HEMOSTASIS CONTROL: SHX6838

## 2023-01-11 HISTORY — PX: SUBMUCOSAL INJECTION: SHX5543

## 2023-01-11 HISTORY — DX: Malignant neoplasm of prostate: C61

## 2023-01-11 HISTORY — PX: ESOPHAGOGASTRODUODENOSCOPY (EGD) WITH PROPOFOL: SHX5813

## 2023-01-11 HISTORY — PX: HOT HEMOSTASIS: SHX5433

## 2023-01-11 LAB — GLUCOSE, CAPILLARY: Glucose-Capillary: 89 mg/dL (ref 70–99)

## 2023-01-11 LAB — BASIC METABOLIC PANEL
Anion gap: 14 (ref 5–15)
BUN: 25 mg/dL — ABNORMAL HIGH (ref 8–23)
CO2: 22 mmol/L (ref 22–32)
Calcium: 9.5 mg/dL (ref 8.9–10.3)
Chloride: 105 mmol/L (ref 98–111)
Creatinine, Ser: 1.91 mg/dL — ABNORMAL HIGH (ref 0.61–1.24)
GFR, Estimated: 39 mL/min — ABNORMAL LOW (ref >60)
Glucose, Bld: 129 mg/dL — ABNORMAL HIGH (ref 70–99)
Potassium: 3.5 mmol/L (ref 3.5–5.1)
Sodium: 141 mmol/L (ref 135–145)

## 2023-01-11 LAB — HEPATIC FUNCTION PANEL
ALT: 27 U/L (ref 0–44)
AST: 18 U/L (ref 15–41)
Albumin: 3.5 g/dL (ref 3.5–5.0)
Alkaline Phosphatase: 151 U/L — ABNORMAL HIGH (ref 38–126)
Bilirubin, Direct: 0.2 mg/dL (ref 0.0–0.2)
Indirect Bilirubin: 0.8 mg/dL (ref 0.3–0.9)
Total Bilirubin: 1 mg/dL (ref 0.3–1.2)
Total Protein: 6.7 g/dL (ref 6.5–8.1)

## 2023-01-11 LAB — RETICULOCYTES
Immature Retic Fract: 31.9 % — ABNORMAL HIGH (ref 2.3–15.9)
RBC.: 3.95 MIL/uL — ABNORMAL LOW (ref 4.22–5.81)
Retic Count, Absolute: 164.7 10*3/uL (ref 19.0–186.0)
Retic Ct Pct: 4.2 % — ABNORMAL HIGH (ref 0.4–3.1)

## 2023-01-11 LAB — HEMOGLOBIN AND HEMATOCRIT, BLOOD
HCT: 34.4 % — ABNORMAL LOW (ref 39.0–52.0)
HCT: 31.1 % — ABNORMAL LOW (ref 39.0–52.0)
HCT: 30 % — ABNORMAL LOW (ref 39.0–52.0)
Hemoglobin: 11.3 g/dL — ABNORMAL LOW (ref 13.0–17.0)
Hemoglobin: 10.3 g/dL — ABNORMAL LOW (ref 13.0–17.0)
Hemoglobin: 9.9 g/dL — ABNORMAL LOW (ref 13.0–17.0)

## 2023-01-11 LAB — IRON AND TIBC
Iron: 58 ug/dL (ref 45–182)
Saturation Ratios: 20 % (ref 17.9–39.5)
TIBC: 286 ug/dL (ref 250–450)
UIBC: 228 ug/dL

## 2023-01-11 LAB — TROPONIN I (HIGH SENSITIVITY)
Troponin I (High Sensitivity): 12 ng/L (ref <18)
Troponin I (High Sensitivity): 11 ng/L (ref <18)

## 2023-01-11 LAB — LIPASE, BLOOD: Lipase: 41 U/L (ref 11–51)

## 2023-01-11 LAB — FERRITIN: Ferritin: 134 ng/mL (ref 24–336)

## 2023-01-11 LAB — CBC
HCT: 36 % — ABNORMAL LOW (ref 39.0–52.0)
Hemoglobin: 11.9 g/dL — ABNORMAL LOW (ref 13.0–17.0)
MCH: 29.7 pg (ref 26.0–34.0)
MCHC: 33.1 g/dL (ref 30.0–36.0)
MCV: 89.8 fL (ref 80.0–100.0)
Platelets: 298 10*3/uL (ref 150–400)
RBC: 4.01 MIL/uL — ABNORMAL LOW (ref 4.22–5.81)
RDW: 16.1 % — ABNORMAL HIGH (ref 11.5–15.5)
WBC: 6.3 10*3/uL (ref 4.0–10.5)
nRBC: 0 % (ref 0.0–0.2)

## 2023-01-11 LAB — HIV ANTIBODY (ROUTINE TESTING W REFLEX): HIV Screen 4th Generation wRfx: NONREACTIVE

## 2023-01-11 LAB — APTT: aPTT: 33 seconds (ref 24–36)

## 2023-01-11 LAB — PROTIME-INR
INR: 1 (ref 0.8–1.2)
Prothrombin Time: 13.5 seconds (ref 11.4–15.2)

## 2023-01-11 SURGERY — ESOPHAGOGASTRODUODENOSCOPY (EGD) WITH PROPOFOL
Anesthesia: General

## 2023-01-11 MED ORDER — SODIUM CHLORIDE 0.9 % IV SOLN: INTRAVENOUS | Status: AC

## 2023-01-11 MED ORDER — PANTOPRAZOLE SODIUM 40 MG IV SOLR
40.0000 mg | Freq: Two times a day (BID) | INTRAVENOUS | Status: DC
  Administered 2023-01-11 – 2023-01-13 (×5): 40 mg via INTRAVENOUS
  Filled 2023-01-11 (×5): qty 10

## 2023-01-11 MED ORDER — SODIUM CHLORIDE 0.9 % IV SOLN: INTRAVENOUS | Status: DC

## 2023-01-11 MED ORDER — IOHEXOL 9 MG/ML PO SOLN
500.0000 mL | ORAL | Status: AC
  Administered 2023-01-11: 500 mL via ORAL

## 2023-01-11 MED ORDER — ONDANSETRON HCL 4 MG/2ML IJ SOLN: 4.0000 mg | Freq: Three times a day (TID) | INTRAMUSCULAR | Status: DC | PRN

## 2023-01-11 MED ORDER — LABETALOL HCL 5 MG/ML IV SOLN: 5.0000 mg | Freq: Four times a day (QID) | INTRAVENOUS | Status: DC | PRN

## 2023-01-11 MED ORDER — LIDOCAINE HCL (CARDIAC) PF 100 MG/5ML IV SOSY
PREFILLED_SYRINGE | INTRAVENOUS | Status: DC | PRN
  Administered 2023-01-11: 100 mg via INTRAVENOUS

## 2023-01-11 MED ORDER — ACETAMINOPHEN 325 MG PO TABS: 650.0000 mg | ORAL_TABLET | Freq: Four times a day (QID) | ORAL | Status: DC | PRN

## 2023-01-11 MED ORDER — EPINEPHRINE 1 MG/10ML IJ SOSY
PREFILLED_SYRINGE | INTRAMUSCULAR | Status: AC
  Filled 2023-01-11: qty 10

## 2023-01-11 MED ORDER — ONDANSETRON HCL 4 MG PO TABS: 4.0000 mg | ORAL_TABLET | Freq: Four times a day (QID) | ORAL | Status: DC | PRN

## 2023-01-11 MED ORDER — PHENYLEPHRINE HCL (PRESSORS) 10 MG/ML IV SOLN
INTRAVENOUS | Status: DC | PRN
  Administered 2023-01-11 (×3): 200 ug via INTRAVENOUS

## 2023-01-11 MED ORDER — SODIUM CHLORIDE 0.9 % IV BOLUS
1000.0000 mL | Freq: Once | INTRAVENOUS | Status: AC
  Administered 2023-01-11: 1000 mL via INTRAVENOUS

## 2023-01-11 MED ORDER — ACETAMINOPHEN 650 MG RE SUPP: 650.0000 mg | Freq: Four times a day (QID) | RECTAL | Status: DC | PRN

## 2023-01-11 MED ORDER — PERFLUTREN LIPID MICROSPHERE
1.0000 mL | INTRAVENOUS | Status: AC | PRN
  Administered 2023-01-11: 2 mL via INTRAVENOUS

## 2023-01-11 MED ORDER — HYDRALAZINE HCL 20 MG/ML IJ SOLN: 10.0000 mg | Freq: Four times a day (QID) | INTRAMUSCULAR | Status: DC | PRN

## 2023-01-11 MED ORDER — SODIUM CHLORIDE (PF) 0.9 % IJ SOLN
PREFILLED_SYRINGE | INTRAMUSCULAR | Status: DC | PRN
  Administered 2023-01-11: 4.5 mL

## 2023-01-11 MED ORDER — PROPOFOL 10 MG/ML IV BOLUS
INTRAVENOUS | Status: DC | PRN
Start: 2023-01-11 — End: 2023-01-11
  Administered 2023-01-11: 80 mg via INTRAVENOUS

## 2023-01-11 MED ORDER — PANTOPRAZOLE 80MG IVPB - SIMPLE MED
80.0000 mg | Freq: Once | INTRAVENOUS | Status: AC
  Administered 2023-01-11: 80 mg via INTRAVENOUS
  Filled 2023-01-11: qty 100

## 2023-01-11 MED ORDER — IOHEXOL 300 MG/ML  SOLN
100.0000 mL | Freq: Once | INTRAMUSCULAR | Status: AC | PRN
  Administered 2023-01-11: 100 mL via INTRAVENOUS

## 2023-01-11 MED ORDER — PROPOFOL 500 MG/50ML IV EMUL
INTRAVENOUS | Status: DC | PRN
  Administered 2023-01-11: 150 ug/kg/min via INTRAVENOUS

## 2023-01-11 MED ORDER — ASPIRIN 81 MG PO TBEC
81.0000 mg | DELAYED_RELEASE_TABLET | Freq: Every day | ORAL | Status: DC
  Administered 2023-01-11: 81 mg via ORAL
  Filled 2023-01-11: qty 1

## 2023-01-11 MED ORDER — ONDANSETRON HCL 4 MG/2ML IJ SOLN: 4.0000 mg | Freq: Four times a day (QID) | INTRAMUSCULAR | Status: DC | PRN

## 2023-01-11 MED ORDER — HYDROMORPHONE HCL 1 MG/ML IJ SOLN: 0.5000 mg | INTRAMUSCULAR | Status: AC | PRN

## 2023-01-11 NOTE — ED Triage Notes (Signed)
Pt presents to ER via ems from home with c/o central chest pain that woke him from his sleep around 0300 tonight.  Pt states pain does radiate down his left arm at times, and is intermittent in nature.  Pt states he is "very tired" and has also had some "dark tarry stools" around 3 weeks ago.  Pt states he has prostate cancer and is on radiation currently.  Pt states he has not had a BM in last 3 weeks.  Pt is otherwise A&O x4 and in NAD.

## 2023-01-11 NOTE — ED Provider Notes (Signed)
[facility] Provider Note    Event Date/Time   First MD Initiated Contact with Patient 01/11/23 [phone]     (approximate)   History   Chest Pain   HPI  Paul Hickman is a 61 y.o. male   Past medical history of prostate cancer status post radiation therapy now on hormone therapy only, who presents with melena, generalized weakness and fatigue, exertional dyspnea, exertional chest pain, over the last 3 weeks.    No alcohol use, no NSAIDs, no history of GI bleeding.    Colonoscopies in the past have been largely unrevealing per patient report, polyps removed, had upper endoscopy performed over 20 years ago.   External Medical Documents Reviewed[facility]22 with a hemoglobin 15.6      Physical Exam   Triage Vital Signs: ED Triage Vitals [01/11/23 0446]  Encounter Vitals Group     BP (!) 143/92     Systolic BP Percentile      Diastolic BP Percentile      Pulse Rate (!) 106     Resp 20     Temp      Temp src      SpO2 100 %     Weight      Height      Head Circumference      Peak Flow      Pain Score 7     Pain Loc      Pain Education      Exclude from Growth Chart     Most recent vital signs: Vitals:   01/11/23 0446  BP: (!) 143/92  Pulse: (!) 106  Resp: 20  SpO2: 100%    General: Awake, no distress.  CV:  Good peripheral perfusion.  Resp:  Normal effort.  Abd:  No distention.  Other:  Slightly tachycardic 106, hypertensive, soft nontender abdomen, awake alert oriented pleasant gentleman no acute distress.  Melena on exam.   ED Results / Procedures / Treatments   Labs (all labs ordered are listed, but only abnormal results are displayed) Labs Reviewed  BASIC METABOLIC PANEL - Abnormal; Notable for the following components:      Result Value   Glucose, Bld 129 (*)    BUN 25 (*)    Creatinine, Ser 1.91 (*)    GFR, Estimated 39 (*)    All other components within normal limits   CBC - Abnormal; Notable for the following components:   RBC 4.01 (*)    Hemoglobin 11.9 (*)    HCT 36.0 (*)    RDW 16.1 (*)    All other components within normal limits  HEPATIC FUNCTION PANEL - Abnormal; Notable for the following components:   Alkaline Phosphatase 151 (*)    All other components within normal limits  LIPASE, BLOOD  PROTIME-INR  APTT  TYPE AND SCREEN  TYPE AND SCREEN  TROPONIN I (HIGH SENSITIVITY)     I ordered and reviewed the above labs they are notable for creatinine is 1.91, hemoglobin 11.9  EKG  ED ECG REPORT I, Pilar Jarvis, the attending physician, personally viewed and interpreted this ECG.   Date: 01/11/2023  EKG Time: 0440  Rate: 98  Rhythm: nsr  Axis: nl  Intervals:none  ST&T Change: no stemi    RADIOLOGY I independently reviewed and interpreted chest x-ray and see no obvious focality or pneumothorax I also reviewed radiologist's formal read.   PROCEDURES:  Critical Care performed:  No  Procedures   MEDICATIONS ORDERED IN ED: Medications  0.9 %  sodium chloride infusion (has no administration in time range)  HYDROmorphone (DILAUDID) injection 0.5 mg (has no administration in time range)  ondansetron (ZOFRAN) injection 4 mg (has no administration in time range)  pantoprazole (PROTONIX) 80 mg /NS 100 mL IVPB (80 mg Intravenous New Bag/Given 01/11/23 0527)  sodium chloride 0.9 % bolus 1,000 mL (1,000 mLs Intravenous New Bag/Given 01/11/23 4782)    External physician / consultants:  I spoke with hospitalist for admission and regarding care plan for this patient.   IMPRESSION / MDM / ASSESSMENT AND PLAN / ED COURSE  I reviewed the triage vital signs and the nursing notes.                                Patient's presentation is most consistent with acute presentation with potential threat to life or bodily function.  Differential diagnosis includes, but is not limited to, acute blood loss anemia, upper GI bleeding, melena,  ACS   The patient is on the cardiac monitor to evaluate for evidence of arrhythmia and/or significant heart rate changes.  MDM:    Melena on exam with drop in H&H from just 10 days ago of over 3 points hemoglobin.  I think this is the driver of his exertional dyspnea, generalized weakness, exertional chest pain.  I doubt ACS but will check EKG which is nonischemic, follow serial troponins.  Will give IV fluid bolus.  Continue to check on hemodynamics, no significant bleeding on my exam, and hemoglobin is still above 11, defer transfusion for now.  Admission.  Started Protonix.       FINAL CLINICAL IMPRESSION(S) / ED DIAGNOSES   Final diagnoses:  Symptomatic anemia  Melena     Rx / DC Orders   ED Discharge Orders     None        Note:  This document was prepared using Dragon voice recognition software and may include unintentional dictation errors.    Pilar Jarvis, MD 01/11/23 [phone]

## 2023-01-11 NOTE — Consult Note (Addendum)
GI Inpatient Consult Note  Reason for Consult: dark stool, fatigue, chest pain   Attending Requesting Consult: Dr. Chipper Herb  History of Present Illness: Paul Hickman is a 62 y.o. male seen for evaluation of meelan at the request of Dr. Chipper Herb.   Patient presented to the emergency room for onset of chest pain, weakness and a tarry stool reported 3 weeks ago.  He had a workup including EKG, troponin x 2 that were negative. GI consulted for further evaluation.  He was started on PPI twice daily. Hgb returned at 11.9 (down from baseline of 15.3 on 12/30/22)  He reports that he usually has a BM about every other day, approximately 3 weeks ago he developed severe issues w/ constipation. He denies having a BM in past 3 weeks. He does feel the last time he had a BM it was dark/black. Despite the constipation he denies any abd pain or bloating. Only symptom he reports w/ the constipation is a poor appetite. Pt reports about a 50 lb weight loss in past few months per report, he was on Ozempic at that time.   He denies any nausea/vomiting. On omeprazole 20mg  daily which controls GERD well. He denies any dysphagia recently (reports hx of EGD w/ dilation 4 years ago at [place]). He denies any post prandial abd pain, etc  He denies any NSAIDs. Non smoker. No alcohol.    Last Colon[facility]: 2014 at [facility]-one tubular adenoma Last Endoscopy: per patient EGD w/ dilation at [facility] (unable to find report) - approximately 2020   Past Medical History:  Past Medical History:  Diagnosis Date   Prostate cancer [facility])     Problem List: Patient Active Problem List   Diagnosis Date Noted   Melena 01/11/2023   Chest pain 01/11/2023   GI bleed 01/11/2023    Past Surgical History: History reviewed. No pertinent surgical history.  Allergies: Allergies  Allergen Reactions   Lisinopril Swelling    Home Medications: Medications Prior to Admission  Medication Sig Dispense Refill Last Dose   colchicine 0.6 MG tablet Take  0.6-1.2 mg by mouth as directed.  TAKE TWO TABLETS BY MOUTH ONCE AT ONSET. MAY REPEAT 1 TABLET AFTER AN HOUR IF NECESSARY FOR ACUTE GOUT. ORDER SET USED AT ONSET. MAY REPEAT 1 TABLET AFTER AN HOUR IF NECESSARY FOR ACUTE GOUT. ORDER SET USED FOR ACUTE GOUT      abiraterone acetate (ZYTIGA) 250 MG tablet Take 1,000 mg by mouth daily.      allopurinol (ZYLOPRIM) 100 MG tablet Take 200 mg by mouth daily.      amLODipine (NORVASC) 10 MG tablet Take 10 mg by mouth daily.      atenolol (TENORMIN) 50 MG tablet Take 50 mg by mouth 2 (two) times daily.      atorvastatin (LIPITOR) 40 MG tablet Take 20 mg by mouth daily.      Calcium Carb-Cholecalciferol 600-10 MG-MCG TABS Take 1 tablet by mouth daily at 6 (six) AM.      empagliflozin (JARDIANCE) 25 MG TABS tablet Take 25 mg by mouth daily.      eplerenone (INSPRA) 25 MG tablet Take 25 mg by mouth daily.      HYDROcodone-acetaminophen (NORCO/VICODIN) 5-325 MG tablet Take 1-2 tablets by mouth every 6 (six) hours as needed for moderate pain or severe pain (no more than 6 tabs daily). 12 tablet 0    methylPREDNISolone (MEDROL DOSEPAK) 4 MG TBPK tablet Take as directed on packaging 21 each 0  omeprazole (PRILOSEC) 20 MG capsule Take 20 mg by mouth daily.      predniSONE (DELTASONE) 5 MG tablet Take 5 mg by mouth daily with breakfast.      Home medication reconciliation was completed with the patient.   Scheduled Inpatient Medications:    aspirin EC  81 mg Oral Daily   pantoprazole (PROTONIX) IV  40 mg Intravenous Q12H    Continuous Inpatient Infusions:    sodium chloride 100 mL/hr at 01/11/23 4098   sodium chloride      PRN Inpatient Medications:  acetaminophen **OR** acetaminophen, hydrALAZINE, HYDROmorphone (DILAUDID) injection, ondansetron **OR** ondansetron (ZOFRAN) IV  Family History: Denies any family hx of colon polyps or colon cancer.  Social History:   reports that he has never smoked. He has never used smokeless tobacco. He reports  that he does not currently use alcohol. He reports that he does not currently use drugs.   Review of Systems: Constitutional: +weight loss  Eyes: No changes in vision. ENT: No oral lesions, sore throat.  GI: see HPI.  Heme/Lymph: No easy bruising.  CV: No chest pain.  GU: No hematuria.  Integumentary: No rashes.  Neuro: No headaches.  Psych: No depression/anxiety.  Endocrine: No heat/cold intolerance.  Allergic/Immunologic: No urticaria.  Resp: No cough, SOB.  Musculoskeletal: No joint swelling.    Physical Examination: BP 127/72 (BP Location: Right Arm)   Pulse (!) 104   Temp 98.2 F (36.8 C) (Oral)   Resp 18   Ht 5\' 7"  (1.702 m)   SpO2 100%   BMI 36.96 kg/m  Gen: NAD, alert and oriented x 4 HEENT: PEERLA, EOMI, Neck: supple, no JVD or thyromegaly Chest: CTA bilaterally, no wheezes, crackles, or other adventitious sounds CV: RRR, no m/g/c/r Abd: soft, minimal TTP, ND, +BS in all four quadrants; no HSM, guarding, ridigity, or rebound tenderness Ext: no edema, well perfused with 2+ pulses, Skin: no rash or lesions noted Lymph: no LAD  Data: Lab Results  Component Value Date   WBC 6.3 01/11/2023   HGB 11.3 (L) 01/11/2023   HCT 34.4 (L) 01/11/2023   MCV 89.8 01/11/2023   PLT 298 01/11/2023   Recent Labs  Lab 01/11/23 0450 01/11/23 1018  HGB 11.9* 11.3*   Lab Results  Component Value Date   NA 141 01/11/2023   K 3.5 01/11/2023   CL 105 01/11/2023   CO2 22 01/11/2023   BUN 25 (H) 01/11/2023   CREATININE 1.91 (H) 01/11/2023   Lab Results  Component Value Date   ALT 27 01/11/2023   AST 18 01/11/2023   ALKPHOS 151 (H) 01/11/2023   BILITOT 1.0 01/11/2023   Recent Labs  Lab 01/11/23 0450  APTT 33  INR 1.0    [facility] 15[facility]6.   10:18am today-Hgb 11.3.  Ferritin 134. Iron 58. Normal TIBC. INR 1.0. Lipase 41.   Assessment/Plan: Mr. Paul Hickman is a 62 y.o. male admitted for chest pain and weakness.   Anemia - has 4 gram drop in Hgb over past  2 weeks with a dark stool reported 3 weeks ago but no further evidence of GI bleeding. He denies any prior issues of similar symptoms. Denies NSAIDs, tobacco, alcohol, anti-coag. Will plan for EGD initially for evaluation. If EGD negative likely will need colonoscopy (see below). Continue NPO and PPI IV BID in interim.  Constipation - acute x 3 weeks, would recommend Chest/a/p CT for further evaluation to exclude obvious cause of obstruction, particularly prior to attempting  prep for colonoscopy.  Reports passing flatus today. Colonoscopy in 2014 with 1 TA [facility] Hx of prostate cancer - reports radiation treatment Alk phos elevation - may be bone elevation, consider isoenzymes. CT chest/a/p as above HTN CKD   Recommendations: EGD with Dr. Timothy Lasso today Maintain 2 Large bore peripheral Ivs Continue PPI NPO until EGD Serial H&Hs  Type & Cross; Transfusion and Resuscitation as per primary team Imaging +/- colonoscopy pending EGD results  Case was discussed with Dr. Timothy Lasso. Thank you for the consult. Please call with questions or concerns.    Ardelia Mems, PA-C [facility]

## 2023-01-11 NOTE — Transfer of Care (Signed)
Immediate Anesthesia Transfer of Care Note  Patient: LAURENT BRAXTON  Procedure(s) Performed: ESOPHAGOGASTRODUODENOSCOPY (EGD) WITH PROPOFOL  Patient Location: PACU and Endoscopy Unit  Anesthesia Type:General  Level of Consciousness: awake  Airway & Oxygen Therapy: Patient Spontanous Breathing  Post-op Assessment: Report given to RN and Post -op Vital signs reviewed and stable  Post vital signs: Reviewed and stable  Last Vitals:  Vitals Value Taken Time  BP    Temp    Pulse    Resp    SpO2      Last Pain:  Vitals:   01/11/23 1502  TempSrc: Temporal  PainSc: 0-No pain         Complications: No notable events documented.

## 2023-01-11 NOTE — Plan of Care (Signed)

## 2023-01-11 NOTE — Interval H&P Note (Signed)
History and Physical Interval Note: Consult note from 01/11/23  was reviewed and there was no interval change after seeing and examining the patient.  Written consent was obtained from the patient after discussion of risks, benefits, and alternatives. Patient has consented to proceed with Esophagogastroduodenoscopy with possible intervention   01/11/2023 2:34 PM  Paul Hickman  has presented today for surgery, with the diagnosis of acute blood loss anemia.  The various methods of treatment have been discussed with the patient and family. After consideration of risks, benefits and other options for treatment, the patient has consented to  Procedure(s): ESOPHAGOGASTRODUODENOSCOPY (EGD) WITH PROPOFOL (N/A) as a surgical intervention.  The patient's history has been reviewed, patient examined, no change in status, stable for surgery.  I have reviewed the patient's chart and labs.  Questions were answered to the patient's satisfaction.     [name]

## 2023-01-11 NOTE — H&P (Addendum)
History and Physical    Paul Hickman GLO:[id number] DOB: 05-06-1961 DOA: 01/11/2023  PCP: [facility], [facility] (Confirm with patient/family/NH records and if not entered, this has to be entered at [facility] point of entry) Patient coming from: Home  I have personally briefly reviewed patient's old Hickman records in [facility]  Chief Complaint: Fatigue, black tarry stool, chest pain  HPI: Paul Hickman is a 62 y.o. male with Hickman history significant of prostate cancer status post therapy, peptic ulcer on PPI, CKD stage IIIa, presented with worsening of fatigue generalized weakness and black tarry stool and new onset of chest pain.  Patient is coronary constipated and claimed that his last bowel movement was 3 weeks ago was black and tarry.  Denies any abdominal pain.  He also reported worsening of generalized weakness over the last 2 to 3 weeks with occasional exertional dyspnea.  This morning, he woke up with severe chest pain 8/10 centrally located radiating to left upper arm, associated with palpitations and worsening of shortness of breath.  He has a remote history of peptic ulcer [facility], and he used to take Tums, switch to Prilosec once a day several months ago by [place].  Denies any nauseous vomiting abdominal pain diarrhea.  Denies any alcohol use.  ED Course: Borderline tachycardia, blood pressure elevated, O2 saturation 99% on room air.  Blood work showed hemoglobin 11.9 compared to his baseline 15.6 about 2 weeks ago, creatinine 1.9, compared to baseline 1.6, K3.5.  Patient was given PPI IV load  Review of Systems: As per HPI otherwise 14 point review of systems negative.    Past Hickman History:  Diagnosis Date   Prostate cancer [facility])     History reviewed. No pertinent surgical history.   reports that he has never smoked. He has never used smokeless tobacco. He reports that he does not currently use alcohol. He reports that he does not currently use  drugs.  Allergies  Allergen Reactions   Lisinopril Swelling    History reviewed. No pertinent family history.   Prior to Admission medications   Medication Sig Start Date End Date Taking? Authorizing Provider  HYDROcodone-acetaminophen (NORCO/VICODIN) 5-325 MG tablet Take 1-2 tablets by mouth every 6 (six) hours as needed for moderate pain or severe pain (no more than 6 tabs daily). 12/23/22 12/23/23  Paul Pollack, MD  methylPREDNISolone (MEDROL DOSEPAK) 4 MG TBPK tablet Take as directed on packaging 12/23/22   Paul Pollack, MD    Physical Exam: Vitals:   01/11/23 0600 01/11/23 0653 01/11/23 0700 01/11/23 0730  BP: (!) 141/87  (!) 146/95 (!) 147/96  Pulse: 97  97 (!) 108  Resp: 18  19 (!) 22  Temp:  97.9 F (36.6 C)    TempSrc:  Oral    SpO2: 100%  100% 99%    Constitutional: NAD, calm, comfortable Vitals:   01/11/23 0600 01/11/23 0653 01/11/23 0700 01/11/23 0730  BP: (!) 141/87  (!) 146/95 (!) 147/96  Pulse: 97  97 (!) 108  Resp: 18  19 (!) 22  Temp:  97.9 F (36.6 C)    TempSrc:  Oral    SpO2: 100%  100% 99%   Eyes: PERRL, lids and conjunctivae normal ENMT: Mucous membranes are moist. Posterior pharynx clear of any exudate or lesions.Normal dentition.  Neck: normal, supple, no masses, no thyromegaly Respiratory: clear to auscultation bilaterally, no wheezing, no crackles. Normal respiratory effort. No accessory muscle use.  Cardiovascular: Regular rate and rhythm,  no murmurs / rubs / gallops. No extremity edema. 2+ pedal pulses. No carotid bruits.  Abdomen: no tenderness, no masses palpated. No hepatosplenomegaly. Bowel sounds positive.  Musculoskeletal: no clubbing / cyanosis. No joint deformity upper and lower extremities. Good ROM, no contractures. Normal muscle tone.  Skin: no rashes, lesions, ulcers. No induration Neurologic: CN 2-12 grossly intact. Sensation intact, DTR normal. Strength 5/5 in all 4.  Psychiatric: Normal judgment and insight. Alert and oriented  x 3. Normal mood.     Labs on Admission: I have personally reviewed following labs and imaging studies  CBC: Recent Labs  Lab 01/11/23 0450  WBC 6.3  HGB 11.9*  HCT 36.0*  MCV 89.8  PLT 298   Basic Metabolic Panel: Recent Labs  Lab 01/11/23 0450  NA 141  K 3.5  CL 105  CO2 22  GLUCOSE 129*  BUN 25*  CREATININE 1.91*  CALCIUM 9.5   GFR: CrCl cannot be calculated (Unknown ideal weight.). Liver Function Tests: Recent Labs  Lab 01/11/23 0450  AST 18  ALT 27  ALKPHOS 151*  BILITOT 1.0  PROT 6.7  ALBUMIN 3.5   Recent Labs  Lab 01/11/23 0450  LIPASE 41   No results for input(s): "AMMONIA" in the last 168 hours. Coagulation Profile: Recent Labs  Lab 01/11/23 0450  INR 1.0   Cardiac Enzymes: No results for input(s): "CKTOTAL", "CKMB", "CKMBINDEX", "TROPONINI" in the last 168 hours. BNP (last 3 results) No results for input(s): "PROBNP" in the last 8760 hours. HbA1C: No results for input(s): "HGBA1C" in the last 72 hours. CBG: No results for input(s): "GLUCAP" in the last 168 hours. Lipid Profile: No results for input(s): "CHOL", "HDL", "LDLCALC", "TRIG", "CHOLHDL", "LDLDIRECT" in the last 72 hours. Thyroid Function Tests: No results for input(s): "TSH", "T4TOTAL", "FREET4", "T3FREE", "THYROIDAB" in the last 72 hours. Anemia Panel: No results for input(s): "VITAMINB12", "FOLATE", "FERRITIN", "TIBC", "IRON", "RETICCTPCT" in the last 72 hours. Urine analysis: No results found for: "COLORURINE", "APPEARANCEUR", "LABSPEC", "PHURINE", "GLUCOSEU", "HGBUR", "BILIRUBINUR", "KETONESUR", "PROTEINUR", "UROBILINOGEN", "NITRITE", "LEUKOCYTESUR"  Radiological Exams on Admission: DG Chest Port 1 View  Result Date: 01/11/2023 CLINICAL DATA:  Central chest pain radiating down the left arm. EXAM: PORTABLE CHEST 1 VIEW COMPARISON:  PA and lateral 10/02/2013 FINDINGS: There are overlying monitor wires. The cardiac size is normal. Stable mediastinum with aortic uncoiling.  The lungs are clear. Smooth chronic pleural thickening noted in the lower lateral hemithoraces. There is thoracic spondylosis with no new osseous findings. IMPRESSION: No evidence of acute chest disease.  Chronic aortic tortuosity. Electronically Signed   By: Almira Bar M.D.   On: 01/11/2023 05:14    EKG: Independently reviewed.  Sinus rhythm, chronic flattened T wave in lead 2 3 aVF  Assessment/Plan Principal Problem:   Melena Active Problems:   Chest pain   GI bleed  (please populate well all problems here in Problem List. (For example, if patient is on BP meds at home and you resume or decide to hold them, it is a problem that needs to be her. Same for CAD, COPD, HLD and so on)  Anginal-like chest pain -Probably secondary to demanding ischemia from acute blood loss anemia -Troponin negative x 2 and EKG showed no significant acute ST changes as compared to old EKG. -Treat acute anemia as below -Echo, to rule out regional wall motion abnormalities, if echo negative, likely patient can follow-up with cardiology as outpatient for stress test.  Melena Acute normocytic anemia secondary to GI bleed -Clinically suspected  recurrent peptic ulcer -Agreed with increasing PPI to twice daily -Repeat H&H this morning, and will consider GI consult -Check FOBT, iron study and reticulocyte count -Type and cross -Consult GI  HTN, uncontrolled -As needed hydralazine  CKD stage IIIa -Euvolemic, creatinine level stable -Patient is n.p.o. now, will initiate maintenance IV fluid  Prostate cancer -On hormone therapy, [facility]  DVT prophylaxis: SCD Code Status: Full code Family Communication: None at bedside Disposition Plan: Patient is sick with lower GI bleed, requiring inpatient management close monitoring and may need blood transfusion and GI workup Consults called: GI consulted Admission status: Tele admit   Emeline General MD [facility] Pager  [phone]  01/11/2023, 8:17 AM

## 2023-01-11 NOTE — Progress Notes (Signed)
Discussed with GI Dr. Timothy Lasso, who performed EGD showed active bleeding duodenal ulcer which was treated with epinephrine/cauterization, GI also suspect possibility of distal side of duodenum bleeding which cannot be identified at this point.  GI ordered CT abdomen pelvis and recommend if CT negative patient can start clear liquid diet.  Will send hemoglobin reading this evening and tonight, GI recommended if there is signs of active bleeding or significant drop of H&H, stat CTA and consult IR.

## 2023-01-11 NOTE — H&P (View-Only) (Signed)
GI Inpatient Consult Note  Reason for Consult: dark stool, fatigue, chest pain   Attending Requesting Consult: Dr. Chipper Herb  History of Present Illness: Paul Hickman is a 62 y.o. male seen for evaluation of meelan at the request of Dr. Chipper Herb.   Patient presented to the emergency room for onset of chest pain, weakness and a tarry stool reported 3 weeks ago.  He had a workup including EKG, troponin x 2 that were negative. GI consulted for further evaluation.  He was started on PPI twice daily. Hgb returned at 11.9 (down from baseline of 15.3 on 12/30/22)  He reports that he usually has a BM about every other day, approximately 3 weeks ago he developed severe issues w/ constipation. He denies having a BM in past 3 weeks. He does feel the last time he had a BM it was dark/black. Despite the constipation he denies any abd pain or bloating. Only symptom he reports w/ the constipation is a poor appetite. Pt reports about a 50 lb weight loss in past few months per report, he was on Ozempic at that time.   He denies any nausea/vomiting. On omeprazole 20mg  daily which controls GERD well. He denies any dysphagia recently (reports hx of EGD w/ dilation 4 years ago at [place]). He denies any post prandial abd pain, etc  He denies any NSAIDs. Non smoker. No alcohol.    [facility]-one tubular adenoma Last Endoscopy: per patient EGD w/ dilation at [facility] (unable to find report) - approximately 2020   Past Medical History:  Past Medical History:  Diagnosis Date   Prostate cancer [facility])     Problem List: Patient Active Problem List   Diagnosis Date Noted   Melena 01/11/2023   Chest pain 01/11/2023   GI bleed 01/11/2023    Past Surgical History: History reviewed. No pertinent surgical history.  Allergies: Allergies  Allergen Reactions   Lisinopril Swelling    Home Medications: Medications Prior to Admission  Medication Sig Dispense Refill Last Dose   colchicine 0.6 MG tablet Take  0.6-1.2 mg by mouth as directed.  TAKE TWO TABLETS BY MOUTH ONCE AT ONSET. MAY REPEAT 1 TABLET AFTER AN HOUR IF NECESSARY FOR ACUTE GOUT. ORDER SET USED AT ONSET. MAY REPEAT 1 TABLET AFTER AN HOUR IF NECESSARY FOR ACUTE GOUT. ORDER SET USED FOR ACUTE GOUT      abiraterone acetate (ZYTIGA) 250 MG tablet Take 1,000 mg by mouth daily.      allopurinol (ZYLOPRIM) 100 MG tablet Take 200 mg by mouth daily.      amLODipine (NORVASC) 10 MG tablet Take 10 mg by mouth daily.      atenolol (TENORMIN) 50 MG tablet Take 50 mg by mouth 2 (two) times daily.      atorvastatin (LIPITOR) 40 MG tablet Take 20 mg by mouth daily.      Calcium Carb-Cholecalciferol 600-10 MG-MCG TABS Take 1 tablet by mouth daily at 6 (six) AM.      empagliflozin (JARDIANCE) 25 MG TABS tablet Take 25 mg by mouth daily.      eplerenone (INSPRA) 25 MG tablet Take 25 mg by mouth daily.      HYDROcodone-acetaminophen (NORCO/VICODIN) 5-325 MG tablet Take 1-2 tablets by mouth every 6 (six) hours as needed for moderate pain or severe pain (no more than 6 tabs daily). 12 tablet 0    methylPREDNISolone (MEDROL DOSEPAK) 4 MG TBPK tablet Take as directed on packaging 21 each 0  omeprazole (PRILOSEC) 20 MG capsule Take 20 mg by mouth daily.      predniSONE (DELTASONE) 5 MG tablet Take 5 mg by mouth daily with breakfast.      Home medication reconciliation was completed with the patient.   Scheduled Inpatient Medications:    aspirin EC  81 mg Oral Daily   pantoprazole (PROTONIX) IV  40 mg Intravenous Q12H    Continuous Inpatient Infusions:    sodium chloride 100 mL/hr at 01/11/23 4098   sodium chloride      PRN Inpatient Medications:  acetaminophen **OR** acetaminophen, hydrALAZINE, HYDROmorphone (DILAUDID) injection, ondansetron **OR** ondansetron (ZOFRAN) IV  Family History: Denies any family hx of colon polyps or colon cancer.  Social History:   reports that he has never smoked. He has never used smokeless tobacco. He reports  that he does not currently use alcohol. He reports that he does not currently use drugs.   Review of Systems: Constitutional: +weight loss  Eyes: No changes in vision. ENT: No oral lesions, sore throat.  GI: see HPI.  Heme/Lymph: No easy bruising.  CV: No chest pain.  GU: No hematuria.  Integumentary: No rashes.  Neuro: No headaches.  Psych: No depression/anxiety.  Endocrine: No heat/cold intolerance.  Allergic/Immunologic: No urticaria.  Resp: No cough, SOB.  Musculoskeletal: No joint swelling.    Physical Examination: BP 127/72 (BP Location: Right Arm)   Pulse (!) 104   Temp 98.2 F (36.8 C) (Oral)   Resp 18   Ht 5\' 7"  (1.702 m)   SpO2 100%   BMI 36.96 kg/m  Gen: NAD, alert and oriented x 4 HEENT: PEERLA, EOMI, Neck: supple, no JVD or thyromegaly Chest: CTA bilaterally, no wheezes, crackles, or other adventitious sounds CV: RRR, no m/g/c/r Abd: soft, minimal TTP, ND, +BS in all four quadrants; no HSM, guarding, ridigity, or rebound tenderness Ext: no edema, well perfused with 2+ pulses, Skin: no rash or lesions noted Lymph: no LAD  Data: Lab Results  Component Value Date   WBC 6.3 01/11/2023   HGB 11.3 (L) 01/11/2023   HCT 34.4 (L) 01/11/2023   MCV 89.8 01/11/2023   PLT 298 01/11/2023   Recent Labs  Lab 01/11/23 0450 01/11/23 1018  HGB 11.9* 11.3*   Lab Results  Component Value Date   NA 141 01/11/2023   K 3.5 01/11/2023   CL 105 01/11/2023   CO2 22 01/11/2023   BUN 25 (H) 01/11/2023   CREATININE 1.91 (H) 01/11/2023   Lab Results  Component Value Date   ALT 27 01/11/2023   AST 18 01/11/2023   ALKPHOS 151 (H) 01/11/2023   BILITOT 1.0 01/11/2023   Recent Labs  Lab 01/11/23 0450  APTT 33  INR 1.0    [facility] 15.6.   10:18am today-Hgb 11.3.  Ferritin 134. Iron 58. Normal TIBC. INR 1.0. Lipase 41.   Assessment/Plan: Paul Hickman is a 62 y.o. male admitted for chest pain and weakness.   Anemia - has 4 gram drop in Hgb over past  2 weeks with a dark stool reported 3 weeks ago but no further evidence of GI bleeding. He denies any prior issues of similar symptoms. Denies NSAIDs, tobacco, alcohol, anti-coag. Will plan for EGD initially for evaluation. If EGD negative likely will need colonoscopy (see below). Continue NPO and PPI IV BID in interim.  Constipation - acute x 3 weeks, would recommend Chest/a/p CT for further evaluation to exclude obvious cause of obstruction, particularly prior to attempting  prep for colonoscopy.  Reports passing flatus today. [facility] in [facility] Hx of prostate cancer - reports radiation treatment Alk phos elevation - may be bone elevation, consider isoenzymes. CT chest/a/p as above HTN CKD   Recommendations: EGD with Dr. Timothy Lasso today Maintain 2 Large bore peripheral Ivs Continue PPI NPO until EGD Serial H&Hs  Type & Cross; Transfusion and Resuscitation as per primary team Imaging +/- colonoscopy pending EGD results  Case was discussed with Dr. Timothy Lasso. Thank you for the consult. Please call with questions or concerns.    Ardelia Mems, PA-C [facility]

## 2023-01-11 NOTE — Anesthesia Procedure Notes (Signed)
Procedure Name: MAC Date/Time: 01/11/2023 2:34 PM  Performed by: Cheral Bay, CRNAPre-anesthesia Checklist: Patient identified, Emergency Drugs available, Suction available, Patient being monitored and Timeout performed Patient Re-evaluated:Patient Re-evaluated prior to induction Oxygen Delivery Method: Simple face mask Induction Type: IV induction Placement Confirmation: positive ETCO2 and CO2 detector

## 2023-01-11 NOTE — Op Note (Addendum)
[facility] Patient Name: Paul Hickman Procedure Date: 01/11/2023 2:00 PM MRN: [id number] Account #: [id number] Date of Birth: 03/15/1961 Admit Type: Inpatient Age: 62 Room: [facility] ENDO ROOM 3 Gender: Male Note Status: Supervisor Override Instrument Name: Patton Salles Endoscope [id number] Procedure:             Upper GI endoscopy Indications:           Melena Providers:             [name] DO, DO Medicines:             Monitored Anesthesia Care Complications:         No immediate complications. Estimated blood loss:                         Minimal. Procedure:             Pre-Anesthesia Assessment:                        - Prior to the procedure, a History and Physical was                         performed, and patient medications and allergies were                         reviewed. The patient is competent. The risks and                         benefits of the procedure and the sedation options and                         risks were discussed with the patient. All questions                         were answered and informed consent was obtained.                         Patient identification and proposed procedure were                         verified by the physician, the nurse, the anesthetist                         and the technician in the endoscopy suite. Mental                         Status Examination: alert and oriented. Airway                         Examination: normal oropharyngeal airway and neck                         mobility. Respiratory Examination: clear to                         auscultation. CV Examination: RRR, no murmurs, no S3                         or S4. Prophylactic Antibiotics: The patient does not  require prophylactic antibiotics. Prior                         Anticoagulants: The patient has taken no anticoagulant                         or antiplatelet agents. ASA Grade Assessment: III - A                          patient with severe systemic disease. After reviewing                         the risks and benefits, the patient was deemed in                         satisfactory condition to undergo the procedure. The                         anesthesia plan was to use monitored anesthesia care                         (MAC). Immediately prior to administration of                         medications, the patient was re-assessed for adequacy                         to receive sedatives. The heart rate, respiratory                         rate, oxygen saturations, blood pressure, adequacy of                         pulmonary ventilation, and response to care were                         monitored throughout the procedure. The physical                         status of the patient was re-assessed after the                         procedure.                        After obtaining informed consent, the endoscope was                         passed under direct vision. Throughout the procedure,                         the patient's blood pressure, pulse, and oxygen                         saturations were monitored continuously. The Endoscope                         was introduced through the mouth, and advanced to the  second part of duodenum. The upper GI endoscopy was                         accomplished without difficulty. The patient tolerated                         the procedure well. Findings:      One oozing cratered duodenal ulcer with a visible vessel was found in       the duodenal bulb. The lesion was 7 mm in largest dimension. Area was       successfully injected with 5 mL of a 0.1 mg/mL solution of epinephrine       for hemostasis. Coagulation for hemostasis using bipolar probe was       successful. Purastat placed over the lesion. No further bleeding noted       after all three modalities. Estimated blood loss was minimal. There may       be additional  ulcer on posterior bulb that cannot be visualized with       gastroscope. After treatment of the oozing vessel, no further fresh       bleeding could be appreciated.      Red blood was found in the third portion of the duodenum. This was       likely to the previously described ulcer. Once this was cleared away,       there was no signs of other bleeding source in this area.      Hematin (altered blood/coffee-ground-like material) was found in the       entire examined stomach. Estimated blood loss: none.      The exam of the stomach was otherwise normal.      Due to active bleeding, biopsies were not taken for H Pylori at this       time as to not confuse the acute bleed setting.      Mildly severe esophagitis with no bleeding was found. Estimated blood       loss: none.      The Z-line was variable. Estimated blood loss: none.      Esophagogastric landmarks were identified: the gastroesophageal junction       was found at 38 cm from the incisors.      The exam of the esophagus was otherwise normal. Impression:            - Oozing duodenal ulcer with a visible vessel.                         Injected. Treated with bipolar cautery.                        - Blood in the third portion of the duodenum.                        - Hematin (altered blood/coffee-ground-like material)                         in the entire stomach.                        - Mildly severe esophagitis with no bleeding.                        -  Z-line variable.                        - Esophagogastric landmarks identified.                        - No specimens collected. Recommendation:        - Patient has a contact number available for                         emergencies. The signs and symptoms of potential                         delayed complications were discussed with the patient.                         Return to normal activities tomorrow. Written                         discharge instructions were provided to  the patient.                        - Return patient to hospital ward for ongoing care.                        - NPO for 4 hours.                        - Clear liquid diet after 4 hours.                        - Continue present medications.                        - Use Protonix (pantoprazole) 40 mg IV BID for 2 days.                        - Use Protonix (pantoprazole) 40 mg PO BID for 8 weeks.                        - No ibuprofen, naproxen, or other non-steroidal                         anti-inflammatory drugs.                        - Repeat upper endoscopy in 8 weeks to evaluate the                         response to therapy.                        - Will evaluate irregular z line and for duodenal                         ulcer healing at that time.                        Order CT chest abdomen pelvis given weight loss and  lack of bowel movement. Rule out                         malignancy/obstruction.                        If no obstruction present, recommend starting bowel                         regimen- may start with goyltely.                        Timing of colonoscopy inpatient vs outpatient to be                         discussed pending findings.                        - Return to GI clinic at appointment to be scheduled.                        - Number [phone] [facility] or follow up                         with [facility] pending patient                         preference                        - Should rebleed occur, recommend IR evaluation for                         embolization                        - The findings and recommendations were discussed with                         the referring physician.                        - The findings and recommendations were discussed with                         the patient's family.                        - The findings and recommendations were discussed with                          the patient. Procedure Code(s):     --- Professional ---                        [id number], Esophagogastroduodenoscopy, flexible,                         transoral; with control of bleeding, any method Diagnosis Code(s):     --- Professional ---                        K26.4, Chronic or  unspecified duodenal ulcer with                         hemorrhage                        K92.2, Gastrointestinal hemorrhage, unspecified                        K20.90, Esophagitis, unspecified without bleeding                        K22.89, Other specified disease of esophagus                        K92.1, Melena (includes Hematochezia) CPT copyright 2022 [employer]. All rights reserved. The codes documented in this report are preliminary and upon coder review may  be revised to meet current compliance requirements. Attending Participation:      I personally performed the entire procedure. Elfredia Nevins, DO [name] DO, DO 01/11/2023 3:17:11 PM This report has been signed electronically. Number of Addenda: 0 Note Initiated On: 01/11/2023 2:00 PM Estimated Blood Loss:  Estimated blood loss was minimal.      [facility]

## 2023-01-11 NOTE — Anesthesia Preprocedure Evaluation (Addendum)
Anesthesia Evaluation  Patient identified by MRN, date of birth, ID band Patient awake    Reviewed: Allergy & Precautions, H&P , NPO status , Patient's Chart, lab work & pertinent test results  Airway Mallampati: II  TM Distance: >3 FB Neck ROM: full    Dental  (+) Missing   Pulmonary neg pulmonary ROS   Pulmonary exam normal        Cardiovascular hypertension, Pt. on home beta blockers and Pt. on medications Normal cardiovascular exam     Neuro/Psych negative neurological ROS  negative psych ROS   GI/Hepatic Neg liver ROS,GERD  Controlled,,  Endo/Other  diabetes, Type 2    Renal/GU Renal InsufficiencyRenal disease  negative genitourinary   Musculoskeletal  (+) Arthritis ,    Abdominal Normal abdominal exam  (+)   Peds  Hematology negative hematology ROS (+)   Anesthesia Other Findings Prostate cancer Melena  Past Medical History: No date: Prostate cancer (HCC)  BMI    Body Mass Index: 36.96 kg/m      Reproductive/Obstetrics negative OB ROS                             Anesthesia Physical Anesthesia Plan  ASA: 3  Anesthesia Plan: General   Post-op Pain Management: Minimal or no pain anticipated   Induction: Intravenous  PONV Risk Score and Plan: Propofol infusion and TIVA  Airway Management Planned: Natural Airway and Nasal Cannula  Additional Equipment:   Intra-op Plan:   Post-operative Plan:   Informed Consent: I have reviewed the patients History and Physical, chart, labs and discussed the procedure including the risks, benefits and alternatives for the proposed anesthesia with the patient or authorized representative who has indicated his/her understanding and acceptance.     Dental Advisory Given  Plan Discussed with: CRNA and Surgeon  Anesthesia Plan Comments:        Anesthesia Quick Evaluation

## 2023-01-12 ENCOUNTER — Encounter: Payer: Self-pay | Admitting: Gastroenterology

## 2023-01-12 DIAGNOSIS — K921 Melena: Secondary | ICD-10-CM | POA: Diagnosis not present

## 2023-01-12 LAB — CBC
HCT: 29.1 % — ABNORMAL LOW (ref 39.0–52.0)
Hemoglobin: 9.5 g/dL — ABNORMAL LOW (ref 13.0–17.0)
MCH: 29.7 pg (ref 26.0–34.0)
MCHC: 32.6 g/dL (ref 30.0–36.0)
MCV: 90.9 fL (ref 80.0–100.0)
Platelets: 260 10*3/uL (ref 150–400)
RBC: 3.2 MIL/uL — ABNORMAL LOW (ref 4.22–5.81)
RDW: 16.4 % — ABNORMAL HIGH (ref 11.5–15.5)
WBC: 4.5 10*3/uL (ref 4.0–10.5)
nRBC: 0 % (ref 0.0–0.2)

## 2023-01-12 LAB — BASIC METABOLIC PANEL
Anion gap: 11 (ref 5–15)
BUN: 22 mg/dL (ref 8–23)
CO2: 20 mmol/L — ABNORMAL LOW (ref 22–32)
Calcium: 8.4 mg/dL — ABNORMAL LOW (ref 8.9–10.3)
Chloride: 112 mmol/L — ABNORMAL HIGH (ref 98–111)
Creatinine, Ser: 1.49 mg/dL — ABNORMAL HIGH (ref 0.61–1.24)
GFR, Estimated: 52 mL/min — ABNORMAL LOW (ref >60)
Glucose, Bld: 85 mg/dL (ref 70–99)
Potassium: 3.2 mmol/L — ABNORMAL LOW (ref 3.5–5.1)
Sodium: 143 mmol/L (ref 135–145)

## 2023-01-12 LAB — ECHOCARDIOGRAM COMPLETE
Area-P 1/2: 4.07 cm2
Height: 67 in
S' Lateral: 1.9 cm

## 2023-01-12 LAB — TYPE AND SCREEN
ABO/RH(D): A POS
Antibody Screen: NEGATIVE

## 2023-01-12 LAB — HEMOGLOBIN AND HEMATOCRIT, BLOOD
HCT: 31.9 % — ABNORMAL LOW (ref 39.0–52.0)
Hemoglobin: 10.5 g/dL — ABNORMAL LOW (ref 13.0–17.0)

## 2023-01-12 MED ORDER — MAGNESIUM CITRATE PO SOLN: 1.0000 | Freq: Once | ORAL | Status: DC | PRN

## 2023-01-12 MED ORDER — POLYETHYLENE GLYCOL 3350 17 GM/SCOOP PO POWD
1.0000 | Freq: Once | ORAL | Status: AC
  Administered 2023-01-12: 255 g via ORAL
  Filled 2023-01-12: qty 255

## 2023-01-12 MED ORDER — BISACODYL 5 MG PO TBEC
10.0000 mg | DELAYED_RELEASE_TABLET | Freq: Once | ORAL | Status: AC
  Administered 2023-01-12: 10 mg via ORAL
  Filled 2023-01-12: qty 2

## 2023-01-12 NOTE — Progress Notes (Signed)
GI Inpatient Follow-up Note  Patient Identification: Paul Hickman is a 62 y.o. male admitted for chest pain, weakness, constipation. Found to have anemia. EGD yesterday with oozing duodenal ulcer w/ visible vessel.   Subjective: patient has not had BM overnight. He denies any abdominal pain, nausea, vomiting. He tolerated clear liquid breakfast. Currently drinking Miralax.  Care discussed w/ RN - no concerns. No bleeding over night   Scheduled Inpatient Medications:   pantoprazole (PROTONIX) IV  40 mg Intravenous Q12H    Continuous Inpatient Infusions:    PRN Inpatient Medications:  acetaminophen **OR** acetaminophen, hydrALAZINE, ondansetron **OR** ondansetron (ZOFRAN) IV  Review of Systems: Constitutional: Weight is stable.  Eyes: No changes in vision. ENT: No oral lesions, sore throat.  GI: see HPI.  Heme/Lymph: No easy bruising.  CV: No chest pain.  GU: No hematuria.  Integumentary: No rashes.  Neuro: No headaches.  Psych: No depression/anxiety.  Endocrine: No heat/cold intolerance.  Allergic/Immunologic: No urticaria.  Resp: No cough, SOB.  Musculoskeletal: No joint swelling.    Physical Examination: BP (!) 145/88   Pulse 99   Temp 99.1 F (37.3 C)   Resp 18   Ht 5\' 7"  (1.702 m)   SpO2 100%   BMI 36.96 kg/m  Gen: NAD, alert and oriented x 4, resting in bed HEENT: PEERLA, EOMI, Neck: supple, no JVD or thyromegaly Chest: CTA bilaterally, no wheezes, crackles, or other adventitious sounds CV: RRR, no m/g/c/r Abd: soft, active BS, minimal diffuse TTP without rebound or guarding Ext: no edema, well perfused with 2+ pulses, Skin: no rash or lesions noted Lymph: no LAD  Data: Lab Results  Component Value Date   WBC 4.5 01/12/2023   HGB 9.5 (L) 01/12/2023   HCT 29.1 (L) 01/12/2023   MCV 90.9 01/12/2023   PLT 260 01/12/2023   Recent Labs  Lab 01/11/23 1752 01/11/23 2255 01/12/23 0327  HGB 10.3* 9.9* 9.5*   Lab Results  Component Value Date   NA  143 01/12/2023   K 3.2 (L) 01/12/2023   CL 112 (H) 01/12/2023   CO2 20 (L) 01/12/2023   BUN 22 01/12/2023   CREATININE 1.49 (H) 01/12/2023   Lab Results  Component Value Date   ALT 27 01/11/2023   AST 18 01/11/2023   ALKPHOS 151 (H) 01/11/2023   BILITOT 1.0 01/11/2023   Recent Labs  Lab 01/11/23 0450  APTT 33  INR 1.0     01/12/19- IMPRESSION: Diffuse sclerotic osseous metastases. No acute cardiopulmonary disease. Nonspecific low-density lesions within the kidneys, not definitively simple cysts. These could be followed up with non emergent renal ultrasound.   Mild bilateral gynecomastia.    EGD yesterday 01/11/23-  Assessment/Plan: Paul Hickman is a 62 y.o. male admitted for chest pain, weakness, and anemia.   Anemia - baseline Hgb was around 15, was 11.9 on admission and has dropped to 9.5, may be equilibrating/dilutional drop. When does have BM may pass some old blood. Will continue monitoring H/H routinely. CT without obvious abnormalities. Will plan for outpatient colonoscopy when clinically feasible, will need to be moving bowels better or will have poor prep. If has large volume bleeding will need to consider CT-A and IR consult  Constipation - continue miralax and dulcolax daily. Continue clear liquid diet for now, if able to have BMs later today could consider advancing to soft/low residue diet  Hx of prostate cancer - reports radiation treatment Alk phos elevation - bile duct normal. Likely 2/2 diffuse osseous metastases from  prostate Ca HTN CKD   Recommendations:  Clear liquid diet for now, may consider advancing diet once moving bowels Continue Miralax and dulcolax Maintain 2 Large bore peripheral IVs Continue PPI Serial H&Hs  Transfusion and resuscitation as per primary team Plan for outpatient colonoscopy    Please call with questions or concerns. Care discussed w/ Dr. Timothy Lasso.     Paul Kaufmann, PA-C [facility]

## 2023-01-12 NOTE — Anesthesia Postprocedure Evaluation (Signed)
Anesthesia Post Note  Patient: Paul Hickman  Procedure(s) Performed: ESOPHAGOGASTRODUODENOSCOPY (EGD) WITH PROPOFOL HOT HEMOSTASIS (ARGON PLASMA COAGULATION/BICAP) HEMOSTASIS CONTROL SUBMUCOSAL INJECTION  Patient location during evaluation: PACU Anesthesia Type: General Level of consciousness: awake and alert Pain management: pain level controlled Vital Signs Assessment: post-procedure vital signs reviewed and stable Respiratory status: spontaneous breathing, nonlabored ventilation and respiratory function stable Cardiovascular status: blood pressure returned to baseline and stable Postop Assessment: no apparent nausea or vomiting Anesthetic complications: no   No notable events documented.   Last Vitals:  Vitals:   01/12/23 0756 01/12/23 1153  BP: (!) 145/88 130/89  Pulse: 99   Resp: 18 16  Temp: 37.3 C 37.1 C  SpO2: 100% 100%    Last Pain:  Vitals:   01/12/23 1153  TempSrc: Oral  PainSc:                  Foye Deer

## 2023-01-12 NOTE — TOC CM/SW Note (Signed)
Transition of Care [facility]) - Inpatient Brief Assessment   Patient Details  Name: THEOPHILUS WILDEBOER MRN: [id number] Date of Birth: [birth year]  Transition of Care [facility]) CM/SW Contact:    Garret Reddish, RN Phone Number: 01/12/2023, 11:14 AM   Clinical Narrative:  Chart reviewed.  Noted that patient was admitted for Melena.  GI has been consulted.  Patient had EGD on yesterday and noted oozing duodenal ulcer with visible vessel.    No TOC need identified at this time.    Transition of Care Asessment: Insurance and Status: Insurance coverage has been reviewed Patient has primary care physician: Yes Home environment has been reviewed: Reviewed Prior level of function:: Reviewed Prior/Current Home Services: No current home services Social Determinants of Health Reivew: SDOH reviewed no interventions necessary Readmission risk has been reviewed: Yes Transition of care needs: no transition of care needs at this time

## 2023-01-12 NOTE — Plan of Care (Signed)

## 2023-01-12 NOTE — Progress Notes (Signed)
PROGRESS NOTE    STEPFON Hickman   GNF:[id number] DOB: 1961/02/09  DOA: 01/11/2023 Date of Service: 01/12/23 PCP: [facility]     Brief Narrative / Hospital Course:  Paul Hickman is a 62 y.o. male with medical history significant of prostate cancer status post therapy, peptic ulcer on PPI, CKD stage IIIa, presented with worsening of fatigue generalized weakness and black tarry stool and new onset of chest pain.  08/21: admitted to hospitalist service, GI consult, underwent EGD (+)bleeding from duodenal ulcer which was injected/cauterized, (+)esophagitis. GI ordered CT abdomen pelvis and recommend if CT negative patient can start clear liquid diet. Follow HH. Of note, CT (+)metastatic disease, reviewed notes from [place], had PET in 09/2022 also showing metastases. Hgb 10.3 08/22: GI ok for CLD, advance once moving bowels. Hgb 9.5 --> 10.5   Diffuse sclerotic osseous metastases. No acute cardiopulmonary disease. Nonspecific low-density lesions within the kidneys, not definitively simple cysts. These could be followed up with non emergent renal ultrasound.  PET 09/2022 "Redemonstration of intensely PSMA-positive widespread  sclerotic osseous metastatic disease. There is a markedly  heterogeneous mixed response to therapy compared to prior PSMA  PET/CT 06/24/2021. There is predominant evidence of partial  response to therapy, however there are many stable, worsening,  and new PSMA-positive sclerotic osseous metastatic lesions in the  interval. " "There are no worsening metastatic lymph nodes  identified."    Consultants:  Gastroenterology  Procedures: 01/11/23: EGD (Dr Timothy Lasso)       ASSESSMENT & PLAN:   Principal Problem:   Melena Active Problems:   Chest pain   GI bleed   Acute upper GI bleed  Melena Acute normocytic anemia secondary to GI bleed Duodenal ulcer w/ bleed noted on EGD 08/21, injected/cauterized  PPI bid D/c ASA Follow H/H - has been  stable/improving  CT angio vs transfusion if H/H dropping  Anginal-like chest pain Secondary to demand ischemia from acute blood loss anemia Troponin negative x 2 and EKG showed no significant acute ST changes as compared to old EKG. Treat acute anemia as above Echo, to rule out regional wall motion abnormalities --> EF 60-65, G1DD    HTN, uncontrolled as needed hydralazine   CKD stage IIIa Euvolemic, creatinine level stable Can d/c IV fluids once taking in po    Metastatic Prostate cancer CT demonstrates metastatic disease, c/w PET findings 09/2022 On hormone therapy [facility]     DVT prophylaxis: SCD (GIB) Pertinent IV fluids/nutrition: d/c conitnuous fluids, CLD advance as tolerated  Central lines / invasive devices: none  Code Status: FULL CODE ACP documentation reviewed: none on file   Current Admission Status: inpatient   TOC needs / Dispo plan: anticipate d/c home  Barriers to discharge / significant pending items: trend HH and advance diet, if remains stable through tonight anticipate d/c home in AM tomorrow             Subjective / Brief ROS:  Patient reports feeling okay today, some abdominal discomfort but not bad. SMall black tool passage today  Denies CP/SOB.  Pain controlled.  Denies new weakness.  Tolerating diet CLD  Family Communication: none at this time     Objective Findings:  Vitals:   01/12/23 0104 01/12/23 0457 01/12/23 0756 01/12/23 1153  BP: (!) 144/93 130/77 (!) 145/88 130/89  Pulse: (!) 107 90 99   Resp: 18 16 18 16   Temp: 98.4 F (36.9 C) 98 F (36.7 C) 99.1 F (37.3  C) 98.8 F (37.1 C)  TempSrc:  Oral  Oral  SpO2: 100% 100% 100% 100%  Height:       No intake or output data in the 24 hours ending 01/12/23 1434 There were no vitals filed for this visit.  Examination:  Physical Exam Constitutional:      General: He is not in acute distress. Cardiovascular:     Rate and Rhythm:  Regular rhythm. Tachycardia present.  Pulmonary:     Breath sounds: Normal breath sounds.  Abdominal:     Palpations: Abdomen is soft.  Skin:    General: Skin is warm and dry.  Neurological:     General: No focal deficit present.     Mental Status: He is alert and oriented to person, place, and time.          Scheduled Medications:   pantoprazole (PROTONIX) IV  40 mg Intravenous Q12H    Continuous Infusions:   PRN Medications:  acetaminophen **OR** acetaminophen, hydrALAZINE, ondansetron **OR** ondansetron (ZOFRAN) IV  Antimicrobials from admission:  Anti-infectives (From admission, onward)    None           Data Reviewed:  I have personally reviewed the following...  CBC: Recent Labs  Lab 01/11/23 0450 01/11/23 1018 01/11/23 1752 01/11/23 2255 01/12/23 0327 01/12/23 1319  WBC 6.3  --   --   --  4.5  --   HGB 11.9* 11.3* 10.3* 9.9* 9.5* 10.5*  HCT 36.0* 34.4* 31.1* 30.0* 29.1* 31.9*  MCV 89.8  --   --   --  90.9  --   PLT 298  --   --   --  260  --    Basic Metabolic Panel: Recent Labs  Lab 01/11/23 0450 01/12/23 0327  NA 141 143  K 3.5 3.2*  CL 105 112*  CO2 22 20*  GLUCOSE 129* 85  BUN 25* 22  CREATININE 1.91* 1.49*  CALCIUM 9.5 8.4*   GFR: CrCl cannot be calculated (Unknown ideal weight.). Liver Function Tests: Recent Labs  Lab 01/11/23 0450  AST 18  ALT 27  ALKPHOS 151*  BILITOT 1.0  PROT 6.7  ALBUMIN 3.5   Recent Labs  Lab 01/11/23 0450  LIPASE 41   No results for input(s): "AMMONIA" in the last 168 hours. Coagulation Profile: Recent Labs  Lab 01/11/23 0450  INR 1.0   Cardiac Enzymes: No results for input(s): "CKTOTAL", "CKMB", "CKMBINDEX", "TROPONINI" in the last 168 hours. BNP (last 3 results) No results for input(s): "PROBNP" in the last 8760 hours. HbA1C: No results for input(s): "HGBA1C" in the last 72 hours. CBG: Recent Labs  Lab 01/11/23 1416  GLUCAP 89   Lipid Profile: No results for input(s):  "CHOL", "HDL", "LDLCALC", "TRIG", "CHOLHDL", "LDLDIRECT" in the last 72 hours. Thyroid Function Tests: No results for input(s): "TSH", "T4TOTAL", "FREET4", "T3FREE", "THYROIDAB" in the last 72 hours. Anemia Panel: Recent Labs    01/11/23 0450 01/11/23 1018  FERRITIN  --  134  TIBC  --  286  IRON  --  58  RETICCTPCT 4.2*  --    Most Recent Urinalysis On File:  No results found for: "COLORURINE", "APPEARANCEUR", "LABSPEC", "PHURINE", "GLUCOSEU", "HGBUR", "BILIRUBINUR", "KETONESUR", "PROTEINUR", "UROBILINOGEN", "NITRITE", "LEUKOCYTESUR" Sepsis Labs: @LABRCNTIP (procalcitonin:4,lacticidven:4) Microbiology: No results found for this or any previous visit (from the past 240 hour(s)).    Radiology Studies last 3 days: ECHOCARDIOGRAM COMPLETE  Result Date: 01/12/2023    ECHOCARDIOGRAM REPORT   Patient Name:   Paul Hickman Date  of Exam: 01/11/2023 Medical Rec #:  [id number]   Height:       67.0 in Accession #:    [id number]  Weight:       236.0 lb Date of Birth:  Dec 31, 1960    BSA:          2.170 m Patient Age:    62 years    BP:           141/87 mmHg Patient Gender: M           HR:           99 bpm. Exam Location:  [facility] Procedure: 2D Echo, Cardiac Doppler, Color Doppler and Intracardiac            Opacification Agent Indications:     R07.9 Chest pain  History:         Patient has no prior history of Echocardiogram examinations.  Sonographer:     Daphine Deutscher RDCS Referring Phys:  [id number] Emeline General Diagnosing Phys: Julien Nordmann MD IMPRESSIONS  1. Left ventricular ejection fraction, by estimation, is 60 to 65%. The left ventricle has normal function. The left ventricle has no regional wall motion abnormalities. Left ventricular diastolic parameters are consistent with Grade I diastolic dysfunction (impaired relaxation).  2. Right ventricular systolic function is normal. The right ventricular size is normal. Tricuspid regurgitation signal is inadequate for assessing PA pressure.  3. The mitral  valve is normal in structure. No evidence of mitral valve regurgitation. No evidence of mitral stenosis.  4. The aortic valve is normal in structure. Aortic valve regurgitation is not visualized. No aortic stenosis is present.  5. The inferior vena cava is normal in size with greater than 50% respiratory variability, suggesting right atrial pressure of 3 mmHg. FINDINGS  Left Ventricle: Left ventricular ejection fraction, by estimation, is 60 to 65%. The left ventricle has normal function. The left ventricle has no regional wall motion abnormalities. Definity contrast agent was given IV to delineate the left ventricular  endocardial borders. The left ventricular internal cavity size was normal in size. There is no left ventricular hypertrophy. Left ventricular diastolic parameters are consistent with Grade I diastolic dysfunction (impaired relaxation). Right Ventricle: The right ventricular size is normal. No increase in right ventricular wall thickness. Right ventricular systolic function is normal. Tricuspid regurgitation signal is inadequate for assessing PA pressure. Left Atrium: Left atrial size was normal in size. Right Atrium: Right atrial size was normal in size. Pericardium: There is no evidence of pericardial effusion. Mitral Valve: The mitral valve is normal in structure. No evidence of mitral valve regurgitation. No evidence of mitral valve stenosis. Tricuspid Valve: The tricuspid valve is normal in structure. Tricuspid valve regurgitation is mild . No evidence of tricuspid stenosis. Aortic Valve: The aortic valve is normal in structure. Aortic valve regurgitation is not visualized. No aortic stenosis is present. Pulmonic Valve: The pulmonic valve was normal in structure. Pulmonic valve regurgitation is not visualized. No evidence of pulmonic stenosis. Aorta: The aortic root is normal in size and structure. Venous: The inferior vena cava is normal in size with greater than 50% respiratory variability,  suggesting right atrial pressure of 3 mmHg. IAS/Shunts: No atrial level shunt detected by color flow Doppler.  LEFT VENTRICLE PLAX 2D LVIDd:         3.20 cm   Diastology LVIDs:         1.90 cm   LV e' medial:    6.42 cm/s LV PW:  1.00 cm   LV E/e' medial:  11.3 LV IVS:        1.00 cm   LV e' lateral:   9.08 cm/s LVOT diam:     1.90 cm   LV E/e' lateral: 8.0 LV SV:         57 LV SV Index:   26 LVOT Area:     2.84 cm  RIGHT VENTRICLE RV Basal diam:  3.80 cm RV S prime:     19.90 cm/s TAPSE (M-mode): 3.1 cm LEFT ATRIUM             Index        RIGHT ATRIUM          Index LA diam:        3.70 cm 1.71 cm/m   RA Area:     8.61 cm LA Vol (A2C):   23.8 ml 10.97 ml/m  RA Volume:   15.80 ml 7.28 ml/m LA Vol (A4C):   18.8 ml 8.67 ml/m LA Biplane Vol: 21.1 ml 9.73 ml/m  AORTIC VALVE LVOT Vmax:   134.00 cm/s LVOT Vmean:  87.167 cm/s LVOT VTI:    0.199 m  AORTA Ao Root diam: 3.60 cm Ao Asc diam:  3.30 cm MITRAL VALVE MV Area (PHT): 4.07 cm     SHUNTS MV Decel Time: 187 msec     Systemic VTI:  0.20 m MV E velocity: 72.55 cm/s   Systemic Diam: 1.90 cm MV A velocity: 109.50 cm/s MV E/A ratio:  0.66 Julien Nordmann MD Electronically signed by Julien Nordmann MD Signature Date/Time: 01/12/2023/12:58:28 PM    Final    CT CHEST ABDOMEN PELVIS W CONTRAST  Result Date: 01/12/2023 CLINICAL DATA:  Unintended weight loss. Prostate cancer. Constipation. EXAM: CT CHEST, ABDOMEN, AND PELVIS WITH CONTRAST TECHNIQUE: Multidetector CT imaging of the chest, abdomen and pelvis was performed following the standard protocol during bolus administration of intravenous contrast. RADIATION DOSE REDUCTION: This exam was performed according to the departmental dose-optimization program which includes automated exposure control, adjustment of the mA and/or kV according to patient size and/or use of iterative reconstruction technique. CONTRAST:  OMNIPAQUE IOHEXOL 300 MG/ML  SOLN COMPARISON:  08/09/2006 FINDINGS: CT CHEST FINDINGS  Cardiovascular: Heart is normal size. Aorta is normal caliber. Tortuous aorta. Mediastinum/Nodes: No mediastinal, hilar, or axillary adenopathy. Trachea and esophagus are unremarkable. Exophytic posterior right thyroid nodule measuring 2.5 cm in maximum diameter. Lungs/Pleura: Lungs are clear. No focal airspace opacities or suspicious nodules. No effusions. Musculoskeletal: Mild bilateral gynecomastia. Extensive sclerotic metastases throughout the osseous structures including diffusely throughout the thoracic spine, numerous bilateral ribs, left scapula and proximal humerus, sternum/manubrium. CT ABDOMEN PELVIS FINDINGS Hepatobiliary: No focal hepatic abnormality. Gallbladder unremarkable. Pancreas: No focal abnormality or ductal dilatation. Spleen: No focal abnormality.  Normal size. Adrenals/Urinary Tract: Bilateral low-density lesions which are not definitively cystic. The largest on the right measures 3 cm. The largest on the left measures 1.6 cm. No hydronephrosis. Adrenal glands unremarkable. Urinary bladder normal. Stomach/Bowel: Stomach, large and small bowel grossly unremarkable. Vascular/Lymphatic: No evidence of aneurysm or adenopathy. Reproductive: Radiation seeds in the region of the prostate. Other: No free fluid or free air. Musculoskeletal: Extensive osseous sclerotic metastases throughout the lumbar spine diffusely as well as the osseous pelvis. IMPRESSION: Diffuse sclerotic osseous metastases. No acute cardiopulmonary disease. Nonspecific low-density lesions within the kidneys, not definitively simple cysts. These could be followed up with non emergent renal ultrasound. Mild bilateral gynecomastia. Electronically Signed   By: Caryn Bee  Dover M.D.   On: 01/12/2023 01:02   DG Chest Port 1 View  Result Date: 01/11/2023 CLINICAL DATA:  Central chest pain radiating down the left arm. EXAM: PORTABLE CHEST 1 VIEW COMPARISON:  PA and lateral 10/02/2013 FINDINGS: There are overlying monitor wires. The  cardiac size is normal. Stable mediastinum with aortic uncoiling. The lungs are clear. Smooth chronic pleural thickening noted in the lower lateral hemithoraces. There is thoracic spondylosis with no new osseous findings. IMPRESSION: No evidence of acute chest disease.  Chronic aortic tortuosity. Electronically Signed   By: Almira Bar M.D.   On: 01/11/2023 05:14             LOS: 1 day       Sunnie Nielsen, DO [facility] 01/12/2023, 2:34 PM    Dictation software may have been used to generate the above note. Typos may occur and escape review in typed/dictated notes. Please contact Dr Lyn Hollingshead directly for clarity if needed.  Staff may message me via secure chat in Epic  but this may not receive an immediate response,  please page me for urgent matters!  If 7PM-7AM, please contact night coverage [email or web address]

## 2023-01-12 NOTE — Hospital Course (Addendum)
Paul Hickman is a 62 y.o. male with medical history significant of prostate cancer status post therapy, peptic ulcer on PPI, CKD stage IIIa, presented with worsening of fatigue generalized weakness and black tarry stool and new onset of chest pain.  08/21: admitted to hospitalist service, GI consult, underwent EGD (+)bleeding from duodenal ulcer which was injected/cauterized, (+)esophagitis. GI ordered CT abdomen pelvis and recommend if CT negative patient can start clear liquid diet. Follow HH. Of note, CT (+)metastatic disease, reviewed notes from [place], had PET in 09/2022 also showing metastases. Hgb 10.3 08/22: GI ok for CLD, advance once moving bowels. Hgb 9.5 --> 10.5 08/23: Hgb stable.    Diffuse sclerotic osseous metastases. No acute cardiopulmonary disease. Nonspecific low-density lesions within the kidneys, not definitively simple cysts. These could be followed up with non emergent renal ultrasound.  PET 09/2022 "Redemonstration of intensely PSMA-positive widespread  sclerotic osseous metastatic disease. There is a markedly  heterogeneous mixed response to therapy compared to prior PSMA  PET/CT 06/24/2021. There is predominant evidence of partial  response to therapy, however there are many stable, worsening,  and new PSMA-positive sclerotic osseous metastatic lesions in the  interval. " "There are no worsening metastatic lymph nodes  identified."    Consultants:  Gastroenterology  Procedures: 01/11/23: EGD (Dr Timothy Lasso)       ASSESSMENT & PLAN:   Principal Problem:   Melena Active Problems:   Chest pain   GI bleed   Acute upper GI bleed  Melena Acute normocytic anemia secondary to GI bleed Duodenal ulcer w/ bleed noted on EGD 08/21, injected/cauterized  PPI bid D/c ASA Follow H/H - has been stable/improving  CT angio vs transfusion if H/H dropping  Anginal-like chest pain Secondary to demand ischemia from acute blood loss anemia Troponin negative x 2 and EKG  showed no significant acute ST changes as compared to old EKG. Treat acute anemia as above Echo, to rule out regional wall motion abnormalities --> EF 60-65, G1DD    HTN as needed hydralazine   CKD stage IIIa Euvolemic, creatinine level stable d/c IV fluids once taking in po  Monitor BMP   Metastatic Prostate cancer CT demonstrates metastatic disease, c/w PET findings 09/2022 On hormone therapy [facility]  Hypokalemia Replace as needed Monitor BMP    DVT prophylaxis: SCD (GIB) Pertinent IV fluids/nutrition: d/c conitnuous fluids, CLD advance as tolerated  Central lines / invasive devices: none  Code Status: FULL CODE ACP documentation reviewed: none on file   Current Admission Status: inpatient   TOC needs / Dispo plan: anticipate d/c home  Barriers to discharge / significant pending items: trend HH and advance diet, if remains stable through tonight anticipate d/c home in AM tomorrow

## 2023-01-13 DIAGNOSIS — K921 Melena: Secondary | ICD-10-CM | POA: Diagnosis not present

## 2023-01-13 LAB — BASIC METABOLIC PANEL
Anion gap: 14 (ref 5–15)
BUN: 13 mg/dL (ref 8–23)
CO2: 20 mmol/L — ABNORMAL LOW (ref 22–32)
Calcium: 9 mg/dL (ref 8.9–10.3)
Chloride: 111 mmol/L (ref 98–111)
Creatinine, Ser: 1.54 mg/dL — ABNORMAL HIGH (ref 0.61–1.24)
GFR, Estimated: 50 mL/min — ABNORMAL LOW (ref >60)
Glucose, Bld: 102 mg/dL — ABNORMAL HIGH (ref 70–99)
Potassium: 3.2 mmol/L — ABNORMAL LOW (ref 3.5–5.1)
Sodium: 145 mmol/L (ref 135–145)

## 2023-01-13 LAB — CBC
HCT: 30.1 % — ABNORMAL LOW (ref 39.0–52.0)
Hemoglobin: 10.2 g/dL — ABNORMAL LOW (ref 13.0–17.0)
MCH: 30.3 pg (ref 26.0–34.0)
MCHC: 33.9 g/dL (ref 30.0–36.0)
MCV: 89.3 fL (ref 80.0–100.0)
Platelets: 254 10*3/uL (ref 150–400)
RBC: 3.37 MIL/uL — ABNORMAL LOW (ref 4.22–5.81)
RDW: 16.8 % — ABNORMAL HIGH (ref 11.5–15.5)
WBC: 4.5 10*3/uL (ref 4.0–10.5)
nRBC: 0 % (ref 0.0–0.2)

## 2023-01-13 MED ORDER — POTASSIUM CHLORIDE CRYS ER 20 MEQ PO TBCR
40.0000 meq | EXTENDED_RELEASE_TABLET | Freq: Once | ORAL | Status: AC
  Administered 2023-01-13: 40 meq via ORAL
  Filled 2023-01-13: qty 2

## 2023-01-13 MED ORDER — PANTOPRAZOLE SODIUM 40 MG PO TBEC: 40.0000 mg | DELAYED_RELEASE_TABLET | Freq: Two times a day (BID) | ORAL | 0 refills | Status: DC

## 2023-01-13 NOTE — Discharge Summary (Signed)
Physician Discharge Summary   Patient: Paul Hickman MRN: [id number]  DOB: 25-Jun-1960   Admit:     Date of Admission: 01/11/2023 Admitted from: home   Discharge: Date of discharge: 01/13/23 Disposition: Home Condition at discharge: good  CODE STATUS: FULL CODE     Discharge Physician: Sunnie Nielsen, DO [facility]     PCP: [facility]  Recommendations for Outpatient Follow-up:  Follow up with PCP [facility], [facility] in 1-2 weeks Please obtain labs/tests: CBC, BMP in 1-2 weeks Please follow up on the following pending results: H pylori  PCP AND OTHER OUTPATIENT PROVIDERS: SEE BELOW FOR SPECIFIC DISCHARGE INSTRUCTIONS PRINTED FOR PATIENT IN ADDITION TO GENERIC AVS PATIENT INFO     Discharge Instructions     Diet - low sodium heart healthy   Complete by: As directed    Avoid spicy/acidic foods   Discharge instructions   Complete by: As directed    We have changed your antacid medicine to pantoprazole 40 mg twice daily. Please avoid spicy foods and NSAID medications (aspirin, ibuprofen, Aleve, Goody's). Please follow up ASAP with your primary care team at the [facility] to monitor blood work.   Increase activity slowly   Complete by: As directed          Discharge Diagnoses: Principal Problem:   Melena Active Problems:   Chest pain   GI bleed   Acute upper GI bleed       Hospital Course: Paul Hickman is a 62 y.o. male with medical history significant of prostate cancer status post therapy, peptic ulcer on PPI, CKD stage IIIa, presented with worsening of fatigue generalized weakness and black tarry stool and new onset of chest pain.  08/21: admitted to hospitalist service, GI consult, underwent EGD (+)bleeding from duodenal ulcer which was injected/cauterized, (+)esophagitis. GI ordered CT abdomen pelvis and recommend if CT negative patient can start clear liquid diet. Follow HH. Of note, CT (+)metastatic disease, reviewed notes from [place],  had PET in 09/2022 also showing metastases. Hgb 10.3 08/22: GI ok for CLD, advance once moving bowels. Hgb 9.5 --> 10.5 08/23: Hgb stable. Tolerating diet     Consultants:  Gastroenterology  Procedures: 01/11/23: EGD (Dr Timothy Lasso)       ASSESSMENT & PLAN:   Melena Acute normocytic anemia secondary to GI bleed Duodenal ulcer w/ bleed noted on EGD 08/21, injected/cauterized  PPI bid D/c ASA Follow H/H - has been stable/improving  CT angio vs transfusion if H/H dropping  Anginal-like chest pain Secondary to demand ischemia from acute blood loss anemia Troponin negative x 2 and EKG showed no significant acute ST changes as compared to old EKG. Treat acute anemia as above Echo, to rule out regional wall motion abnormalities --> EF 60-65, G1DD    HTN as needed hydralazine   CKD stage IIIa Euvolemic, creatinine level stable d/c IV fluids once taking in po  Monitor BMP   Metastatic Prostate cancer CT demonstrates metastatic disease, c/w PET findings 09/2022 On hormone therapy [facility]  Hypokalemia Replace as needed Monitor BMP    DVT prophylaxis: SCD (GIB) Pertinent IV fluids/nutrition: d/c conitnuous fluids, CLD advance as tolerated  Central lines / invasive devices: none  Code Status: FULL CODE ACP documentation reviewed: none on file   Current Admission Status: inpatient   TOC needs / Dispo plan: anticipate d/c home  Barriers to discharge / significant pending items: trend HH and advance diet, if remains stable  through tonight anticipate d/c home in AM tomorrow            Discharge Instructions  Allergies as of 01/13/2023       Reactions   Lisinopril Swelling        Medication List     STOP taking these medications    omeprazole 20 MG capsule Commonly known as: PRILOSEC       TAKE these medications    abiraterone acetate 250 MG tablet Commonly known as: ZYTIGA Take 1,000 mg by mouth daily.    allopurinol 100 MG tablet Commonly known as: ZYLOPRIM Take 200 mg by mouth daily.   amLODipine 10 MG tablet Commonly known as: NORVASC Take 10 mg by mouth daily.   atenolol 50 MG tablet Commonly known as: TENORMIN Take 50 mg by mouth 2 (two) times daily.   atorvastatin 40 MG tablet Commonly known as: LIPITOR Take 20 mg by mouth daily.   Calcium Carb-Cholecalciferol 600-10 MG-MCG Tabs Take 1 tablet by mouth daily at 6 (six) AM.   colchicine 0.6 MG tablet Take 0.6-1.2 mg by mouth as directed.  TAKE TWO TABLETS BY MOUTH ONCE AT ONSET. MAY REPEAT 1 TABLET AFTER AN HOUR IF NECESSARY FOR ACUTE GOUT. ORDER SET USED AT ONSET. MAY REPEAT 1 TABLET AFTER AN HOUR IF NECESSARY FOR ACUTE GOUT. ORDER SET USED FOR ACUTE GOUT   empagliflozin 25 MG Tabs tablet Commonly known as: JARDIANCE Take 25 mg by mouth daily.   eplerenone 25 MG tablet Commonly known as: INSPRA Take 25 mg by mouth daily.   HYDROcodone-acetaminophen 5-325 MG tablet Commonly known as: NORCO/VICODIN Take 1-2 tablets by mouth every 6 (six) hours as needed for moderate pain or severe pain (no more than 6 tabs daily).   methylPREDNISolone 4 MG Tbpk tablet Commonly known as: MEDROL DOSEPAK Take as directed on packaging   pantoprazole 40 MG tablet Commonly known as: Protonix Take 1 tablet (40 mg total) by mouth 2 (two) times daily before a meal.   predniSONE 5 MG tablet Commonly known as: DELTASONE Take 5 mg by mouth daily with breakfast.          Allergies  Allergen Reactions   Lisinopril Swelling     Subjective: pt denies abdominal pain or nausea after breakfast.    Discharge Exam: BP (!) 147/97 (BP Location: Left Arm)   Pulse 98   Temp 98.6 F (37 C) (Oral)   Resp 18   Ht 5\' 7"  (1.702 m)   SpO2 100%   BMI 36.96 kg/m  General: Pt is alert, awake, not in acute distress Cardiovascular: RRR, S1/S2 +, no rubs, no gallops Respiratory: CTA bilaterally, no wheezing, no rhonchi Abdominal: Soft, NT,  ND, bowel sounds + Extremities: no edema, no cyanosis     The results of significant diagnostics from this hospitalization (including imaging, microbiology, ancillary and laboratory) are listed below for reference.     Microbiology: No results found for this or any previous visit (from the past 240 hour(s)).   Labs: BNP (last 3 results) No results for input(s): "BNP" in the last 8760 hours. Basic Metabolic Panel: Recent Labs  Lab 01/11/23 0450 01/12/23 0327 01/13/23 0320  NA 141 143 145  K 3.5 3.2* 3.2*  CL 105 112* 111  CO2 22 20* 20*  GLUCOSE 129* 85 102*  BUN 25* 22 13  CREATININE 1.91* 1.49* 1.54*  CALCIUM 9.5 8.4* 9.0   Liver Function Tests: Recent Labs  Lab 01/11/23 0450  AST 18  ALT  27  ALKPHOS 151*  BILITOT 1.0  PROT 6.7  ALBUMIN 3.5   Recent Labs  Lab 01/11/23 0450  LIPASE 41   No results for input(s): "AMMONIA" in the last 168 hours. CBC: Recent Labs  Lab 01/11/23 0450 01/11/23 1018 01/11/23 1752 01/11/23 2255 01/12/23 0327 01/12/23 1319 01/13/23 0320  WBC 6.3  --   --   --  4.5  --  4.5  HGB 11.9*   < > 10.3* 9.9* 9.5* 10.5* 10.2*  HCT 36.0*   < > 31.1* 30.0* 29.1* 31.9* 30.1*  MCV 89.8  --   --   --  90.9  --  89.3  PLT 298  --   --   --  260  --  254   < > = values in this interval not displayed.   Cardiac Enzymes: No results for input(s): "CKTOTAL", "CKMB", "CKMBINDEX", "TROPONINI" in the last 168 hours. BNP: Invalid input(s): "POCBNP" CBG: Recent Labs  Lab 01/11/23 1416  GLUCAP 89   D-Dimer No results for input(s): "DDIMER" in the last 72 hours. Hgb A1c No results for input(s): "HGBA1C" in the last 72 hours. Lipid Profile No results for input(s): "CHOL", "HDL", "LDLCALC", "TRIG", "CHOLHDL", "LDLDIRECT" in the last 72 hours. Thyroid function studies No results for input(s): "TSH", "T4TOTAL", "T3FREE", "THYROIDAB" in the last 72 hours.  Invalid input(s): "FREET3" Anemia work up Recent Labs    01/11/23 0450  01/11/23 1018  FERRITIN  --  134  TIBC  --  286  IRON  --  58  RETICCTPCT 4.2*  --    Urinalysis No results found for: "COLORURINE", "APPEARANCEUR", "LABSPEC", "PHURINE", "GLUCOSEU", "HGBUR", "BILIRUBINUR", "KETONESUR", "PROTEINUR", "UROBILINOGEN", "NITRITE", "LEUKOCYTESUR" Sepsis Labs Recent Labs  Lab 01/11/23 0450 01/12/23 0327 01/13/23 0320  WBC 6.3 4.5 4.5   Microbiology No results found for this or any previous visit (from the past 240 hour(s)). Imaging ECHOCARDIOGRAM COMPLETE  Result Date: 01/12/2023    ECHOCARDIOGRAM REPORT   Patient Name:   CYPRESS TINNES Date of Exam: 01/11/2023 Medical Rec #:  [id number]   Height:       67.0 in Accession #:    [id number]  Weight:       236.0 lb Date of Birth:  March 10, 1961    BSA:          2.170 m Patient Age:    62 years    BP:           141/87 mmHg Patient Gender: M           HR:           99 bpm. Exam Location:  [facility] Procedure: 2D Echo, Cardiac Doppler, Color Doppler and Intracardiac            Opacification Agent Indications:     R07.9 Chest pain  History:         Patient has no prior history of Echocardiogram examinations.  Sonographer:     Daphine Deutscher RDCS Referring Phys:  [id number] Emeline General Diagnosing Phys: Julien Nordmann MD IMPRESSIONS  1. Left ventricular ejection fraction, by estimation, is 60 to 65%. The left ventricle has normal function. The left ventricle has no regional wall motion abnormalities. Left ventricular diastolic parameters are consistent with Grade I diastolic dysfunction (impaired relaxation).  2. Right ventricular systolic function is normal. The right ventricular size is normal. Tricuspid regurgitation signal is inadequate for assessing PA pressure.  3. The mitral valve is normal in structure. No evidence of mitral  valve regurgitation. No evidence of mitral stenosis.  4. The aortic valve is normal in structure. Aortic valve regurgitation is not visualized. No aortic stenosis is present.  5. The inferior vena cava is  normal in size with greater than 50% respiratory variability, suggesting right atrial pressure of 3 mmHg. FINDINGS  Left Ventricle: Left ventricular ejection fraction, by estimation, is 60 to 65%. The left ventricle has normal function. The left ventricle has no regional wall motion abnormalities. Definity contrast agent was given IV to delineate the left ventricular  endocardial borders. The left ventricular internal cavity size was normal in size. There is no left ventricular hypertrophy. Left ventricular diastolic parameters are consistent with Grade I diastolic dysfunction (impaired relaxation). Right Ventricle: The right ventricular size is normal. No increase in right ventricular wall thickness. Right ventricular systolic function is normal. Tricuspid regurgitation signal is inadequate for assessing PA pressure. Left Atrium: Left atrial size was normal in size. Right Atrium: Right atrial size was normal in size. Pericardium: There is no evidence of pericardial effusion. Mitral Valve: The mitral valve is normal in structure. No evidence of mitral valve regurgitation. No evidence of mitral valve stenosis. Tricuspid Valve: The tricuspid valve is normal in structure. Tricuspid valve regurgitation is mild . No evidence of tricuspid stenosis. Aortic Valve: The aortic valve is normal in structure. Aortic valve regurgitation is not visualized. No aortic stenosis is present. Pulmonic Valve: The pulmonic valve was normal in structure. Pulmonic valve regurgitation is not visualized. No evidence of pulmonic stenosis. Aorta: The aortic root is normal in size and structure. Venous: The inferior vena cava is normal in size with greater than 50% respiratory variability, suggesting right atrial pressure of 3 mmHg. IAS/Shunts: No atrial level shunt detected by color flow Doppler.  LEFT VENTRICLE PLAX 2D LVIDd:         3.20 cm   Diastology LVIDs:         1.90 cm   LV e' medial:    6.42 cm/s LV PW:         1.00 cm   LV E/e'  medial:  11.3 LV IVS:        1.00 cm   LV e' lateral:   9.08 cm/s LVOT diam:     1.90 cm   LV E/e' lateral: 8.0 LV SV:         57 LV SV Index:   26 LVOT Area:     2.84 cm  RIGHT VENTRICLE RV Basal diam:  3.80 cm RV S prime:     19.90 cm/s TAPSE (M-mode): 3.1 cm LEFT ATRIUM             Index        RIGHT ATRIUM          Index LA diam:        3.70 cm 1.71 cm/m   RA Area:     8.61 cm LA Vol (A2C):   23.8 ml 10.97 ml/m  RA Volume:   15.80 ml 7.28 ml/m LA Vol (A4C):   18.8 ml 8.67 ml/m LA Biplane Vol: 21.1 ml 9.73 ml/m  AORTIC VALVE LVOT Vmax:   134.00 cm/s LVOT Vmean:  87.167 cm/s LVOT VTI:    0.199 m  AORTA Ao Root diam: 3.60 cm Ao Asc diam:  3.30 cm MITRAL VALVE MV Area (PHT): 4.07 cm     SHUNTS MV Decel Time: 187 msec     Systemic VTI:  0.20 m MV E velocity: 72.55 cm/s  Systemic Diam: 1.90 cm MV A velocity: 109.50 cm/s MV E/A ratio:  0.66 Julien Nordmann MD Electronically signed by Julien Nordmann MD Signature Date/Time: 01/12/2023/12:58:28 PM    Final    CT CHEST ABDOMEN PELVIS W CONTRAST  Result Date: 01/12/2023 CLINICAL DATA:  Unintended weight loss. Prostate cancer. Constipation. EXAM: CT CHEST, ABDOMEN, AND PELVIS WITH CONTRAST TECHNIQUE: Multidetector CT imaging of the chest, abdomen and pelvis was performed following the standard protocol during bolus administration of intravenous contrast. RADIATION DOSE REDUCTION: This exam was performed according to the departmental dose-optimization program which includes automated exposure control, adjustment of the mA and/or kV according to patient size and/or use of iterative reconstruction technique. CONTRAST:  OMNIPAQUE IOHEXOL 300 MG/ML  SOLN COMPARISON:  08/09/2006 FINDINGS: CT CHEST FINDINGS Cardiovascular: Heart is normal size. Aorta is normal caliber. Tortuous aorta. Mediastinum/Nodes: No mediastinal, hilar, or axillary adenopathy. Trachea and esophagus are unremarkable. Exophytic posterior right thyroid nodule measuring 2.5 cm in maximum  diameter. Lungs/Pleura: Lungs are clear. No focal airspace opacities or suspicious nodules. No effusions. Musculoskeletal: Mild bilateral gynecomastia. Extensive sclerotic metastases throughout the osseous structures including diffusely throughout the thoracic spine, numerous bilateral ribs, left scapula and proximal humerus, sternum/manubrium. CT ABDOMEN PELVIS FINDINGS Hepatobiliary: No focal hepatic abnormality. Gallbladder unremarkable. Pancreas: No focal abnormality or ductal dilatation. Spleen: No focal abnormality.  Normal size. Adrenals/Urinary Tract: Bilateral low-density lesions which are not definitively cystic. The largest on the right measures 3 cm. The largest on the left measures 1.6 cm. No hydronephrosis. Adrenal glands unremarkable. Urinary bladder normal. Stomach/Bowel: Stomach, large and small bowel grossly unremarkable. Vascular/Lymphatic: No evidence of aneurysm or adenopathy. Reproductive: Radiation seeds in the region of the prostate. Other: No free fluid or free air. Musculoskeletal: Extensive osseous sclerotic metastases throughout the lumbar spine diffusely as well as the osseous pelvis. IMPRESSION: Diffuse sclerotic osseous metastases. No acute cardiopulmonary disease. Nonspecific low-density lesions within the kidneys, not definitively simple cysts. These could be followed up with non emergent renal ultrasound. Mild bilateral gynecomastia. Electronically Signed   By: Charlett Nose M.D.   On: 01/12/2023 01:02   DG Chest Port 1 View  Result Date: 01/11/2023 CLINICAL DATA:  Central chest pain radiating down the left arm. EXAM: PORTABLE CHEST 1 VIEW COMPARISON:  PA and lateral 10/02/2013 FINDINGS: There are overlying monitor wires. The cardiac size is normal. Stable mediastinum with aortic uncoiling. The lungs are clear. Smooth chronic pleural thickening noted in the lower lateral hemithoraces. There is thoracic spondylosis with no new osseous findings. IMPRESSION: No evidence of acute  chest disease.  Chronic aortic tortuosity. Electronically Signed   By: Almira Bar M.D.   On: 01/11/2023 05:14      Time coordinating discharge: over 30 minutes  SIGNED:  Sunnie Nielsen DO [facility]

## 2023-01-14 LAB — H. PYLORI ANTIGEN, STOOL: H. Pylori Stool Ag, Eia: NEGATIVE

## 2023-02-21 DEATH — deceased
